# Patient Record
Sex: Male | Born: 1990 | Race: White | Hispanic: No | Smoking: Current every day smoker
Health system: Southern US, Community
[De-identification: ages and names within clinical notes are randomized; demographics above are authoritative.]

## PROBLEM LIST (undated history)

## (undated) DIAGNOSIS — K759 Inflammatory liver disease, unspecified: Secondary | ICD-10-CM

## (undated) DIAGNOSIS — G43909 Migraine, unspecified, not intractable, without status migrainosus: Secondary | ICD-10-CM

## (undated) DIAGNOSIS — K760 Fatty (change of) liver, not elsewhere classified: Secondary | ICD-10-CM

## (undated) DIAGNOSIS — K7460 Unspecified cirrhosis of liver: Secondary | ICD-10-CM

## (undated) DIAGNOSIS — G9389 Other specified disorders of brain: Secondary | ICD-10-CM

## (undated) DIAGNOSIS — R569 Unspecified convulsions: Secondary | ICD-10-CM

## (undated) DIAGNOSIS — I10 Essential (primary) hypertension: Secondary | ICD-10-CM

## (undated) DIAGNOSIS — E559 Vitamin D deficiency, unspecified: Secondary | ICD-10-CM

## (undated) DIAGNOSIS — S8991XA Unspecified injury of right lower leg, initial encounter: Secondary | ICD-10-CM

## (undated) HISTORY — DX: Migraine, unspecified, not intractable, without status migrainosus: G43.909

## (undated) HISTORY — PX: NASAL SINUS SURGERY: SHX719

## (undated) HISTORY — DX: Unspecified convulsions: R56.9

## (undated) HISTORY — DX: Other specified disorders of brain: G93.89

## (undated) HISTORY — DX: Unspecified injury of right lower leg, initial encounter: S89.91XA

## (undated) HISTORY — DX: Vitamin D deficiency, unspecified: E55.9

---
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
## 2018-08-15 DIAGNOSIS — J029 Acute pharyngitis, unspecified: Secondary | ICD-10-CM | POA: Diagnosis not present

## 2018-08-15 DIAGNOSIS — Z20828 Contact with and (suspected) exposure to other viral communicable diseases: Secondary | ICD-10-CM | POA: Diagnosis not present

## 2018-08-15 DIAGNOSIS — J019 Acute sinusitis, unspecified: Secondary | ICD-10-CM | POA: Diagnosis not present

## 2018-08-15 DIAGNOSIS — J069 Acute upper respiratory infection, unspecified: Secondary | ICD-10-CM | POA: Diagnosis not present

## 2018-11-10 ENCOUNTER — Other Ambulatory Visit: Payer: Self-pay

## 2018-11-10 DIAGNOSIS — Z20822 Contact with and (suspected) exposure to covid-19: Secondary | ICD-10-CM

## 2018-11-12 LAB — NOVEL CORONAVIRUS, NAA: SARS-CoV-2, NAA: NOT DETECTED

## 2020-01-12 ENCOUNTER — Encounter: Payer: Self-pay | Admitting: Emergency Medicine

## 2020-01-12 ENCOUNTER — Other Ambulatory Visit: Payer: Self-pay

## 2020-01-12 ENCOUNTER — Ambulatory Visit
Admission: EM | Admit: 2020-01-12 | Discharge: 2020-01-12 | Disposition: A | Discharge status: Home / Self Care | Payer: BC Managed Care – PPO | Attending: Emergency Medicine | Admitting: Emergency Medicine

## 2020-01-12 DIAGNOSIS — R059 Cough, unspecified: Secondary | ICD-10-CM

## 2020-01-12 DIAGNOSIS — J069 Acute upper respiratory infection, unspecified: Secondary | ICD-10-CM

## 2020-01-12 MED ORDER — DEXAMETHASONE SODIUM PHOSPHATE 10 MG/ML IJ SOLN
10.0000 mg | Freq: Once | INTRAMUSCULAR | Status: AC
  Administered 2020-01-12: 10 mg via INTRAMUSCULAR

## 2020-01-12 MED ORDER — ALBUTEROL SULFATE HFA 108 (90 BASE) MCG/ACT IN AERS
2.0000 | INHALATION_SPRAY | Freq: Once | RESPIRATORY_TRACT | Status: AC
  Administered 2020-01-12: 2 via RESPIRATORY_TRACT

## 2020-01-12 MED ORDER — PREDNISONE 10 MG (21) PO TBPK: ORAL_TABLET | Freq: Every day | ORAL | 0 refills | Status: DC

## 2020-01-12 MED ORDER — BENZONATATE 100 MG PO CAPS: 100.0000 mg | ORAL_CAPSULE | Freq: Three times a day (TID) | ORAL | 0 refills | Status: DC

## 2020-01-12 NOTE — Discharge Instructions (Signed)

## 2020-01-12 NOTE — ED Provider Notes (Signed)
[facility]   [id number] 01/12/20 Arrival Time: 0831   CC: Cough  SUBJECTIVE: History from: patient.  Jake King is a 29 y.o. male who presents with chest congestion, productive cough, wheezing, and hoarseness x 5 days.  Denies sick exposure to COVID, flu or strep.  Does admit to tobacco use.  Has tried OTC medications without relief.  Symptoms are made worse at night.  Reports previous symptoms in the past with bronchitis.   Denies fever, chills, chest pain, nausea, changes in bowel or bladder habits.    ROS: As per HPI.  All other pertinent ROS negative.     History reviewed. No pertinent past medical history. Past Surgical History:  Procedure Laterality Date  . NASAL SINUS SURGERY     Allergies  Allergen Reactions  . Sulfa Antibiotics     Joints swell   No current facility-administered medications on file prior to encounter.   No current outpatient medications on file prior to encounter.   Social History   Socioeconomic History  . Marital status: Single    Spouse name: Not on file  . Number of children: Not on file  . Years of education: Not on file  . Highest education level: Not on file  Occupational History  . Not on file  Tobacco Use  . Smoking status: Never Smoker  . Smokeless tobacco: Never Used  Substance and Sexual Activity  . Alcohol use: Not on file  . Drug use: Not on file  . Sexual activity: Not on file  Other Topics Concern  . Not on file  Social History Narrative  . Not on file   Social Determinants of Health   Financial Resource Strain:   . Difficulty of Paying Living Expenses: Not on file  Food Insecurity:   . Worried About Programme researcher, broadcasting/film/video in the Last Year: Not on file  . Ran Out of Food in the Last Year: Not on file  Transportation Needs:   . Lack of Transportation (Medical): Not on file  . Lack of Transportation (Non-Medical): Not on file  Physical Activity:   . Days of Exercise per Week: Not on file  .  Minutes of Exercise per Session: Not on file  Stress:   . Feeling of Stress : Not on file  Social Connections:   . Frequency of Communication with Friends and Family: Not on file  . Frequency of Social Gatherings with Friends and Family: Not on file  . Attends Religious Services: Not on file  . Active Member of Clubs or Organizations: Not on file  . Attends Banker Meetings: Not on file  . Marital Status: Not on file  Intimate Partner Violence:   . Fear of Current or Ex-Partner: Not on file  . Emotionally Abused: Not on file  . Physically Abused: Not on file  . Sexually Abused: Not on file   No family history on file.  OBJECTIVE:  Vitals:   01/12/20 0844 01/12/20 0847  BP: (!) 143/98   Pulse: (!) 105   Resp: 16   Temp: 98.5 F (36.9 C)   TempSrc: Oral   SpO2: 96%   Weight:  180 lb (81.6 kg)  Height:  5\' 11"  (1.803 m)     General appearance: alert; appears fatigued, but nontoxic; speaking in full sentences and tolerating own secretions HEENT: NCAT; Ears: EACs clear, TMs pearly gray; Eyes: PERRL.  EOM grossly intact. Nose: nares patent without rhinorrhea, Throat: oropharynx clear, tonsils non erythematous or  enlarged, uvula midline  Neck: supple without LAD Lungs: unlabored respirations, symmetrical air entry; cough: moderate; no respiratory distress; harsh wheezes heard throughout bilateral lungs Heart: regular rate and rhythm.   Skin: warm and dry Psychological: alert and cooperative; normal mood and affect  ASSESSMENT & PLAN:  1. Cough   2. Viral URI with cough     Meds ordered this encounter  Medications  . benzonatate (TESSALON) 100 MG capsule    Sig: Take 1 capsule (100 mg total) by mouth every 8 (eight) hours.    Dispense:  21 capsule    Refill:  0    Order Specific Question:   Supervising Provider    Answer:   Jake King [[id number]]    COVID testing ordered.  It will take between 5-7 days for test results.  Someone will contact you  regarding abnormal results.    In the meantime: You should remain isolated in your home for 10 days from symptom onset AND greater than 72 hours after symptoms resolution (absence of fever without the use of fever-reducing medication and improvement in respiratory symptoms), whichever is longer Get plenty of rest and push fluids Tessalon Perles prescribed for cough Use OTC zyrtec for nasal congestion, runny nose, and/or sore throat Use OTC flonase for nasal congestion and runny nose Use medications daily for symptom relief Use OTC medications like ibuprofen or tylenol as needed fever or pain Call or go to the ED if you have any new or worsening symptoms such as fever, worsening cough, shortness of breath, chest tightness, chest pain, turning blue, changes in mental status, etc...   Steroid shot, prednisone, and albuterol inhaler given for bronchitis  Reviewed expectations re: course of current medical issues. Questions answered. Outlined signs and symptoms indicating need for more acute intervention. Patient verbalized understanding. After Visit Summary given.         [name], PA-C 01/12/20 0900

## 2020-01-12 NOTE — ED Triage Notes (Addendum)
Chest congestion, cough and is having some hoarsness since Sunday

## 2020-01-13 LAB — SARS-COV-2, NAA 2 DAY TAT

## 2020-01-13 LAB — NOVEL CORONAVIRUS, NAA: SARS-CoV-2, NAA: NOT DETECTED

## 2020-03-04 ENCOUNTER — Telehealth (INDEPENDENT_AMBULATORY_CARE_PROVIDER_SITE_OTHER): Payer: Self-pay | Admitting: Nurse Practitioner

## 2020-03-04 ENCOUNTER — Other Ambulatory Visit: Payer: Self-pay

## 2020-03-04 ENCOUNTER — Ambulatory Visit (HOSPITAL_COMMUNITY)
Admission: RE | Admit: 2020-03-04 | Discharge: 2020-03-04 | Disposition: A | Discharge status: Home / Self Care | Payer: BC Managed Care – PPO | Source: Ambulatory Visit | Attending: Nurse Practitioner | Admitting: Nurse Practitioner

## 2020-03-04 ENCOUNTER — Encounter (INDEPENDENT_AMBULATORY_CARE_PROVIDER_SITE_OTHER): Payer: Self-pay | Admitting: Nurse Practitioner

## 2020-03-04 ENCOUNTER — Ambulatory Visit (INDEPENDENT_AMBULATORY_CARE_PROVIDER_SITE_OTHER): Payer: BC Managed Care – PPO | Admitting: Nurse Practitioner

## 2020-03-04 VITALS — BP 150/96 | HR 75 | Temp 97.5°F | Ht 71.0 in | Wt 187.6 lb

## 2020-03-04 DIAGNOSIS — Z87898 Personal history of other specified conditions: Secondary | ICD-10-CM

## 2020-03-04 DIAGNOSIS — E559 Vitamin D deficiency, unspecified: Secondary | ICD-10-CM

## 2020-03-04 DIAGNOSIS — I10 Essential (primary) hypertension: Secondary | ICD-10-CM | POA: Diagnosis not present

## 2020-03-04 DIAGNOSIS — R079 Chest pain, unspecified: Secondary | ICD-10-CM

## 2020-03-04 DIAGNOSIS — R5383 Other fatigue: Secondary | ICD-10-CM | POA: Diagnosis not present

## 2020-03-04 DIAGNOSIS — R202 Paresthesia of skin: Secondary | ICD-10-CM | POA: Diagnosis not present

## 2020-03-04 DIAGNOSIS — R0789 Other chest pain: Secondary | ICD-10-CM | POA: Diagnosis not present

## 2020-03-04 DIAGNOSIS — G43119 Migraine with aura, intractable, without status migrainosus: Secondary | ICD-10-CM

## 2020-03-04 DIAGNOSIS — R0602 Shortness of breath: Secondary | ICD-10-CM | POA: Diagnosis not present

## 2020-03-04 DIAGNOSIS — R531 Weakness: Secondary | ICD-10-CM | POA: Diagnosis not present

## 2020-03-04 DIAGNOSIS — G1220 Motor neuron disease, unspecified: Secondary | ICD-10-CM

## 2020-03-04 MED ORDER — AMLODIPINE BESYLATE 2.5 MG PO TABS: 2.5000 mg | ORAL_TABLET | Freq: Every day | ORAL | 3 refills | Status: DC

## 2020-03-04 NOTE — Patient Instructions (Addendum)
Please proceed to the radiology department at [facility] to have your chest x-ray.

## 2020-03-04 NOTE — Progress Notes (Signed)
Subjective:  Patient ID: Jake King, male    DOB: 10/16/1990  Age: 29 y.o. MRN: [id number]  CC:  Chief Complaint  Patient presents with  . Establish Care      HPI  This patient arrives today for the above.  He has not had a primary care provider in approximately 2 years.  He tells me he moved here from [place] about 2 years ago.  Before moving he mentions that he had been told he has a mass in his brain as well as in his sinus cavity and was told to follow-up and have repeat scans every 6 months and they did want him to see a neurosurgeon.  He tells me he has not been having the scans completed since he is moved down here nor has he seen a neurosurgeon.  He tells me that he does have a history of migraines and they initially started when he was approximately 29 years old.  He would get them about once a year and then it increased to approximately 3 times a year.  Over the last 3 months he tells me he has been having a migraine every other week.  He does get an aura which is usually a pain in his neck.  He experiences nausea and vomiting with his migraine.  Migraines last 2 to 4 days.  He is also noticed that he has been experiencing neurologic changes.  Specifically sensory deficits to his left hand (dorsal side also includes digits 1-4), sensory deficits to his right leg, foot drop in his right foot, and weakness to his left leg.  He does have a family history of MS.  He also mentions to me that he has what he calls chest wall pain to the left side of his sternum around the 5th-6th rib.  This is been ongoing for a few months.  He tells me that the pain is all the time and worsens when he lays on his left side.  He denies any shortness of breath, or worsening pain with physical activity.  He tells me when he coughs the pain is worse.  He tells me that a few months ago he does recall some sort of upper respiratory infection, or possibly pneumonia.  The pain to his chest wall has  persisted since then.  Past Medical History:  Diagnosis Date  . Brain mass   . Migraine   . Right knee injury   . Seizures ([place])   . Vitamin D deficiency       Family History  Problem Relation Age of Onset  . Other Mother        "Twisted Colon"  . Mental illness Mother   . Diabetes Father   . Hypertension Father   . Pneumonia Father   . Congestive Heart Failure Paternal Grandfather   . Hypertension Paternal Grandfather   . Diabetes Paternal Grandfather   . Multiple sclerosis Paternal Grandfather     Social History   Social History Narrative  . Not on file   Social History   Tobacco Use  . Smoking status: Current Every Day Smoker    Packs/day: 0.50  . Smokeless tobacco: Never Used  Substance Use Topics  . Alcohol use: Yes    Alcohol/week: 2.0 - 3.0 standard drinks    Types: 2 - 3 Cans of beer per week     No outpatient medications have been marked as taking for the 03/04/20 encounter (Office Visit) with Ailene Ards,  NP.    ROS:  Review of Systems  Constitutional: Negative for chills, fever and weight loss.  Eyes: Positive for blurred vision.  Respiratory: Positive for cough, sputum production, shortness of breath and wheezing (in the mornings upon waking up). Hemoptysis: brown and yellow.   Cardiovascular: Positive for chest pain and PND. Negative for orthopnea (Pain when laying on left side).  Gastrointestinal: Positive for diarrhea. Negative for abdominal pain and blood in stool.  Musculoskeletal: Positive for neck pain.  Neurological: Positive for dizziness, sensory change (left hand; resolved now), weakness and headaches.     Objective:   Today's Vitals: BP (!) 150/96   Pulse 75   Temp (!) 97.5 F (36.4 C)   Ht 5' 11"  (1.803 m)   Wt 187 lb 9.6 oz (85.1 kg)   SpO2 97%   BMI 26.16 kg/m  Vitals with BMI 03/04/2020 01/12/2020  Height 5' 11"  5' 11"   Weight 187 lbs 10 oz 180 lbs  BMI 09.38 18.29  Systolic 937 169  Diastolic 96 98  Pulse 75  105     Physical Exam Vitals reviewed.  Constitutional:      Appearance: Normal appearance.  HENT:     Head: Normocephalic and atraumatic.  Cardiovascular:     Rate and Rhythm: Normal rate and regular rhythm.  Pulmonary:     Effort: Pulmonary effort is normal.     Breath sounds: Wheezing present.  Chest:     Chest wall: Tenderness present. No mass.    Musculoskeletal:     Cervical back: Neck supple.     Comments: Weakness of cervical spine with rotation to right  Skin:    General: Skin is warm and dry.  Neurological:     Mental Status: He is alert and oriented to person, place, and time.     Cranial Nerves: Cranial nerves are intact.     Sensory: Sensory deficit present.     Motor: Weakness and tremor present. No pronator drift.     Coordination: Coordination is intact.     Gait: Gait is intact.     Deep Tendon Reflexes:     Reflex Scores:      Brachioradialis reflexes are 2+ on the right side and 2+ on the left side.      Patellar reflexes are 2+ on the right side and 2+ on the left side.    Comments: Left arm weakness when compared to right arm; Right foot drop (inability to fully dorsiflex), left leg weakness when compared to right, reduced sensation to right leg  Psychiatric:        Mood and Affect: Mood normal.        Behavior: Behavior normal.        Thought Content: Thought content normal.        Judgment: Judgment normal.      EKG: NSR     Assessment and Plan   1. Chest pain, unspecified type   2. History of seizures   3. Weakness   4. Paresthesias   5. Intractable migraine with aura without status migrainosus   6. 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 Hypertension, unspecified type   9. Fatigue, unspecified type      Plan: 1.  Probable costochondritis, however will send patient for chest x-ray for further evaluation. 2.-5.,  7.  Will refer patient to neurology for further evaluation.  We will also order MRI of brain,  cervical spine, thoracic spine, and  lumbar spine as I am concerned he may have MS.  Also would like to evaluate whether or not he continues to have lesions/mass on his brain.  We will also check blood work as ordered below for further evaluation. 6.  We will check blood work for further evaluation. 8.  We will initiate him on low-dose amlodipine, he will return in 2 weeks to check blood pressure. 9.  We will check blood work for further evaluation.   Tests ordered Orders Placed This Encounter  Procedures  . MR Brain W Wo Contrast  . MR CERVICAL SPINE W WO CONTRAST  . MR THORACIC SPINE W WO CONTRAST  . MR Lumbar Spine W Wo Contrast  . DG Chest 2 View  . CBC with Differential/Platelets  . CMP with eGFR([facility])  . Lipid Panel  . Hemoglobin A1c  . TSH  . T3, Free  . T4, Free  . Vitamin D, 25-hydroxy  . Ambulatory referral to Neurology  . EKG 12-Lead      Meds ordered this encounter  Medications  . amLODipine (NORVASC) 2.5 MG tablet    Sig: Take 1 tablet (2.5 mg total) by mouth daily.    Dispense:  90 tablet    Refill:  3    Order Specific Question:   Supervising Provider    Answer:   Doree Albee [[id number]]    Patient to follow-up in 2 weeks or sooner as needed for close monitoring of blood pressure.  Ailene Ards, NP

## 2020-03-04 NOTE — Telephone Encounter (Signed)
I have placed order for MRI of brain, cervical/thoracic/lumbar spine. Please make sure these are approved via insurance and scheduled. I have also ordered referral to neurology please work on this as well.

## 2020-03-05 ENCOUNTER — Other Ambulatory Visit (INDEPENDENT_AMBULATORY_CARE_PROVIDER_SITE_OTHER): Payer: Self-pay | Admitting: Nurse Practitioner

## 2020-03-05 DIAGNOSIS — R748 Abnormal levels of other serum enzymes: Secondary | ICD-10-CM

## 2020-03-05 LAB — COMPLETE METABOLIC PANEL WITH GFR
AG Ratio: 1.8 (calc) (ref 1.0–2.5)
ALT: 97 U/L — ABNORMAL HIGH (ref 9–46)
AST: 169 U/L — ABNORMAL HIGH (ref 10–40)
Albumin: 4.1 g/dL (ref 3.6–5.1)
Alkaline phosphatase (APISO): 100 U/L (ref 36–130)
BUN: 7 mg/dL (ref 7–25)
CO2: 28 mmol/L (ref 20–32)
Calcium: 8.8 mg/dL (ref 8.6–10.3)
Chloride: 104 mmol/L (ref 98–110)
Creat: 0.79 mg/dL (ref 0.60–1.35)
GFR, Est African American: 143 mL/min/{1.73_m2} (ref >=60)
GFR, Est Non African American: 123 mL/min/{1.73_m2} (ref >=60)
Globulin: 2.3 g/dL (calc) (ref 1.9–3.7)
Glucose, Bld: 85 mg/dL (ref 65–99)
Potassium: 4 mmol/L (ref 3.5–5.3)
Sodium: 143 mmol/L (ref 135–146)
Total Bilirubin: 0.7 mg/dL (ref 0.2–1.2)
Total Protein: 6.4 g/dL (ref 6.1–8.1)

## 2020-03-05 LAB — CBC WITH DIFFERENTIAL/PLATELET
Absolute Monocytes: 388 cells/uL (ref 200–950)
Basophils Absolute: 30 cells/uL (ref 0–200)
Basophils Relative: 0.8 %
Eosinophils Absolute: 87 cells/uL (ref 15–500)
Eosinophils Relative: 2.3 %
HCT: 39.5 % (ref 38.5–50.0)
Hemoglobin: 13.6 g/dL (ref 13.2–17.1)
Lymphs Abs: 882 cells/uL (ref 850–3900)
MCH: 33.9 pg — ABNORMAL HIGH (ref 27.0–33.0)
MCHC: 34.4 g/dL (ref 32.0–36.0)
MCV: 98.5 fL (ref 80.0–100.0)
MPV: 9.9 fL (ref 7.5–12.5)
Monocytes Relative: 10.2 %
Neutro Abs: 2413 cells/uL (ref 1500–7800)
Neutrophils Relative %: 63.5 %
Platelets: 250 10*3/uL (ref 140–400)
RBC: 4.01 10*6/uL — ABNORMAL LOW (ref 4.20–5.80)
RDW: 14.3 % (ref 11.0–15.0)
Total Lymphocyte: 23.2 %
WBC: 3.8 10*3/uL (ref 3.8–10.8)

## 2020-03-05 LAB — HEMOGLOBIN A1C
Hgb A1c MFr Bld: 5.2 % of total Hgb (ref <5.7)
Mean Plasma Glucose: 103 mg/dL
eAG (mmol/L): 5.7 mmol/L

## 2020-03-05 LAB — LIPID PANEL
Cholesterol: 141 mg/dL (ref <200)
HDL: 44 mg/dL (ref >=40)
LDL Cholesterol (Calc): 65 mg/dL (calc)
Non-HDL Cholesterol (Calc): 97 mg/dL (calc) (ref <130)
Total CHOL/HDL Ratio: 3.2 (calc) (ref <5.0)
Triglycerides: 308 mg/dL — ABNORMAL HIGH (ref <150)

## 2020-03-05 LAB — VITAMIN D 25 HYDROXY (VIT D DEFICIENCY, FRACTURES): Vit D, 25-Hydroxy: 22 ng/mL — ABNORMAL LOW (ref 30–100)

## 2020-03-05 LAB — T4, FREE: Free T4: 1 ng/dL (ref 0.8–1.8)

## 2020-03-05 LAB — T3, FREE: T3, Free: 3.9 pg/mL (ref 2.3–4.2)

## 2020-03-05 LAB — TSH: TSH: 0.27 mIU/L — ABNORMAL LOW (ref 0.40–4.50)

## 2020-03-05 MED ORDER — PREDNISONE 20 MG PO TABS: 40.0000 mg | ORAL_TABLET | Freq: Every day | ORAL | 0 refills | Status: DC

## 2020-03-05 NOTE — Progress Notes (Signed)
Nellie, I have ordered ultrasound of this patient's liver after viewing his lab results. Please work on this authorization in addition to the MRI orders from yesterday. Thank you.

## 2020-03-13 NOTE — Telephone Encounter (Signed)
Prior Berkley Harvey is submitted; pending approval;tba.

## 2020-03-14 ENCOUNTER — Telehealth (INDEPENDENT_AMBULATORY_CARE_PROVIDER_SITE_OTHER): Payer: Self-pay

## 2020-03-14 NOTE — Telephone Encounter (Signed)
Molli Knock, try to call him on Monday to verify his insurance. I see that he has mychart you could consider sending him a message in [place] as well.

## 2020-03-15 ENCOUNTER — Encounter (INDEPENDENT_AMBULATORY_CARE_PROVIDER_SITE_OTHER): Payer: Self-pay

## 2020-03-18 ENCOUNTER — Telehealth (INDEPENDENT_AMBULATORY_CARE_PROVIDER_SITE_OTHER): Payer: Self-pay

## 2020-03-18 NOTE — Telephone Encounter (Signed)
Will have to have a provider for a peer 2 peer. [phone]. XVE:[id number]

## 2020-03-18 NOTE — Telephone Encounter (Signed)
Peer-to-peer completed and authorization number is [id number]; approval through the following dates: 03/18/20-09/13/20.

## 2020-03-19 ENCOUNTER — Other Ambulatory Visit: Payer: Self-pay

## 2020-03-19 ENCOUNTER — Telehealth (INDEPENDENT_AMBULATORY_CARE_PROVIDER_SITE_OTHER): Payer: BC Managed Care – PPO | Admitting: Nurse Practitioner

## 2020-03-19 ENCOUNTER — Encounter (INDEPENDENT_AMBULATORY_CARE_PROVIDER_SITE_OTHER): Payer: Self-pay | Admitting: Nurse Practitioner

## 2020-03-19 DIAGNOSIS — E559 Vitamin D deficiency, unspecified: Secondary | ICD-10-CM

## 2020-03-19 DIAGNOSIS — R531 Weakness: Secondary | ICD-10-CM

## 2020-03-19 DIAGNOSIS — R748 Abnormal levels of other serum enzymes: Secondary | ICD-10-CM

## 2020-03-19 DIAGNOSIS — R202 Paresthesia of skin: Secondary | ICD-10-CM | POA: Diagnosis not present

## 2020-03-19 DIAGNOSIS — M940 Chondrocostal junction syndrome [Tietze]: Secondary | ICD-10-CM

## 2020-03-19 DIAGNOSIS — I10 Essential (primary) hypertension: Secondary | ICD-10-CM

## 2020-03-19 NOTE — Progress Notes (Signed)
Due to national recommendations of social distancing related to the COVID19 pandemic, an audio-only tele-health visit was felt to be the most appropriate encounter type for this patient today. I connected with  Jake King on 03/19/20 utilizing audio-only technology and verified that I am speaking with the correct person using two identifiers. The patient was located at their home, and I was located at the office of [facility] during the encounter. I discussed the limitations of evaluation and management by telemedicine. The patient expressed understanding and agreed to proceed.    Subjective:  Patient ID: Jake King, male    DOB: 08/05/1990  Age: 30 y.o. MRN: [id number]  CC:  Chief Complaint  Patient presents with  . Hypertension  . Other    Elevated liver enzymes, vitamin D deficiency, costochondritis, neurologic changes, history of brain mass      HPI  This patient arrives today for virtual visit for the above.  Hypertension: At last office visit he was establishing care he was noted to have elevated blood pressure.  He had mention that he has noticed elevated blood pressure in the past so he was started on 2.5 mg of amlodipine daily.  He tells me he does not have an at home blood pressure cuffs were not able to check his blood pressure today.  He tells me has been tolerating the medication well.  Elevated liver enzymes: Blood work at last office visit show elevated liver enzymes.  AST was 169 and ALT was 97.  He does have a history of substance abuse and does not drink alcohol on regular basis.  Alkaline phosphatase and albumin were within normal limits.  Ultrasound of liver has been ordered and is scheduled for next week.  Vitamin D deficiency: Last office visit serum level of vitamin D was 22.  He has started taking vitamin D3.  He is taking a total of about 10,700 IUs daily from vitamin D3 supplementation as well as taking a  multivitamin.  Costochondritis: He was complaining of chest wall pain and we did treat him with prednisone.  He tells me today that he has completed his course of prednisone and the pain has resolved.  Neurologic changes/History of brain mass: He had mentioned that he has a history of a brain mass at last office visit and has not been getting his scans every 6 months as was previously recommended per his report from previous provider. He does have MRI scheduled of the brain next week for further evaluation.  He is also been experiencing some neurologic changes such as weakness to the left leg, sensory changes to the right leg, and right foot drop.  He does have MRI of the spine scheduled next week as well for further evaluation of possible MS.  He tells me that his symptoms appear to be stable at this time.  Past Medical History:  Diagnosis Date  . Brain mass   . Migraine   . Right knee injury   . Seizures ([facility])   . Vitamin D deficiency       Family History  Problem Relation Age of Onset  . Other Mother        "Twisted Colon"  . Mental illness Mother   . Diabetes Father   . Hypertension Father   . Pneumonia Father   . Congestive Heart Failure Paternal Grandfather   . Hypertension Paternal Grandfather   . Diabetes Paternal Grandfather   . Multiple sclerosis Paternal Grandfather  Social History   Social History Narrative  . Not on file   Social History   Tobacco Use  . Smoking status: Current Every Day Smoker    Packs/day: 0.50  . Smokeless tobacco: Never Used  Substance Use Topics  . Alcohol use: Yes    Alcohol/week: 2.0 - 3.0 standard drinks    Types: 2 - 3 Cans of beer per week     Current Meds  Medication Sig  . amLODipine (NORVASC) 2.5 MG tablet Take 1 tablet (2.5 mg total) by mouth daily.  . Cholecalciferol (VITAMIN D-3) 5000 UNIT/ML LIQD Place 2 mLs under the tongue daily. Total of 10,000IUs daily  . Multiple Vitamins-Minerals (MULTIVITAMIN WITH MINERALS)  tablet Take 1 tablet by mouth daily.    ROS:  Review of Systems  Constitutional: Positive for malaise/fatigue. Negative for fever.  Eyes: Positive for blurred vision.  Respiratory: Negative for shortness of breath.   Cardiovascular: Negative for chest pain.  Neurological: Positive for sensory change and weakness.     Objective:   Today's Vitals: There were no vitals taken for this visit. Vitals with BMI 03/19/2020 03/04/2020 01/12/2020  Height - 5\' 11"  5\' 11"   Weight - 187 lbs 10 oz 180 lbs  BMI - 26.18 25.12  Systolic (No Data) 150 143  Diastolic (No Data) 96 98  Pulse - 75 105     Physical Exam Comprehensive physical exam was not conducted today as office visit was conducted remotely over the phone.  He did sound well over the phone he was alert and oriented and answers questions appropriately.  He appeared to have appropriate thought processes and judgment.      Assessment and Plan   1. Elevated liver enzymes   2. Weakness   3. Paresthesias   4. Vitamin D deficiency   5. Hypertension, unspecified type   6. Costochondritis      Plan: 1.  He will have ultrasound as scheduled.  We will collect blood work next week after imaging tests including CMP, GGT, hepatitis panel, PT-INR for further evaluation. 2.,  3.  He will follow-up to have MRIs completed as scheduled.  He was also referred to neurology and does have an appointment in 2 weeks and plans on following up with them. 4.  We will consider rechecking serum vitamin D level at blood work next week. 5.  When he comes in to have blood work checked next week we will also have nurses check his blood pressure. 6.  Resolved, no further work-up recommended at this time.  Tests ordered No orders of the defined types were placed in this encounter.     No orders of the defined types were placed in this encounter.   Patient to follow-up next week for nurses visit to have blood pressure checked and then to go to the lab  for blood draw.  Blood work has been ordered today in preparation for her visit next week. Telephone conversation lasted for 14 minutes.  , NP

## 2020-03-25 ENCOUNTER — Telehealth (INDEPENDENT_AMBULATORY_CARE_PROVIDER_SITE_OTHER): Payer: Self-pay

## 2020-03-25 NOTE — Telephone Encounter (Signed)
Patient called and was asking when his Brain MRI, all his back MRI's and his [place] if the are approved and when will these be scheduled?   I see a Peer to Peer approval but not sure on which orders are approved?  Patient would like a call back at [phone]

## 2020-03-25 NOTE — Telephone Encounter (Signed)
Called patient and gave him the dates for his images and his ultrasound. Patient verbalized an understanding and will call the billing department if he has any questions about his insurance.

## 2020-03-25 NOTE — Telephone Encounter (Signed)
Everything is approved and I believe they are all scheduled for 03/28/20. When I spoke to him last he was aware of these being scheduled and was aware of the times. I am not sure why he is now calling asking about the appointment dates/times, but if you could please make sure these are in fact scheduled on 03/28/20 and then call him back to let him know this and that scans were approved when I completed the prior auth phone call the other day. Thank you. Sorry for any confusion.

## 2020-03-28 ENCOUNTER — Ambulatory Visit (HOSPITAL_COMMUNITY)
Admission: RE | Admit: 2020-03-28 | Discharge: 2020-03-28 | Disposition: A | Discharge status: Home / Self Care | Payer: BC Managed Care – PPO | Source: Ambulatory Visit | Attending: Nurse Practitioner | Admitting: Nurse Practitioner

## 2020-03-28 ENCOUNTER — Ambulatory Visit (INDEPENDENT_AMBULATORY_CARE_PROVIDER_SITE_OTHER): Payer: BC Managed Care – PPO | Admitting: Nurse Practitioner

## 2020-03-28 ENCOUNTER — Other Ambulatory Visit: Payer: Self-pay

## 2020-03-28 ENCOUNTER — Other Ambulatory Visit (INDEPENDENT_AMBULATORY_CARE_PROVIDER_SITE_OTHER): Payer: Self-pay | Admitting: Nurse Practitioner

## 2020-03-28 VITALS — BP 128/82

## 2020-03-28 DIAGNOSIS — R748 Abnormal levels of other serum enzymes: Secondary | ICD-10-CM | POA: Diagnosis not present

## 2020-03-28 DIAGNOSIS — G1220 Motor neuron disease, unspecified: Secondary | ICD-10-CM

## 2020-03-28 DIAGNOSIS — R945 Abnormal results of liver function studies: Secondary | ICD-10-CM | POA: Diagnosis not present

## 2020-03-28 DIAGNOSIS — E559 Vitamin D deficiency, unspecified: Secondary | ICD-10-CM | POA: Diagnosis not present

## 2020-03-28 DIAGNOSIS — K760 Fatty (change of) liver, not elsewhere classified: Secondary | ICD-10-CM | POA: Diagnosis not present

## 2020-03-28 DIAGNOSIS — R531 Weakness: Secondary | ICD-10-CM

## 2020-03-28 DIAGNOSIS — R202 Paresthesia of skin: Secondary | ICD-10-CM

## 2020-03-28 DIAGNOSIS — M5124 Other intervertebral disc displacement, thoracic region: Secondary | ICD-10-CM | POA: Diagnosis not present

## 2020-03-28 DIAGNOSIS — I10 Essential (primary) hypertension: Secondary | ICD-10-CM | POA: Diagnosis not present

## 2020-03-28 DIAGNOSIS — M47812 Spondylosis without myelopathy or radiculopathy, cervical region: Secondary | ICD-10-CM | POA: Diagnosis not present

## 2020-03-28 DIAGNOSIS — M4802 Spinal stenosis, cervical region: Secondary | ICD-10-CM | POA: Diagnosis not present

## 2020-03-28 DIAGNOSIS — J3489 Other specified disorders of nose and nasal sinuses: Secondary | ICD-10-CM | POA: Diagnosis not present

## 2020-03-28 DIAGNOSIS — M47814 Spondylosis without myelopathy or radiculopathy, thoracic region: Secondary | ICD-10-CM | POA: Diagnosis not present

## 2020-03-28 DIAGNOSIS — M2578 Osteophyte, vertebrae: Secondary | ICD-10-CM | POA: Diagnosis not present

## 2020-03-28 DIAGNOSIS — M5126 Other intervertebral disc displacement, lumbar region: Secondary | ICD-10-CM | POA: Diagnosis not present

## 2020-03-28 DIAGNOSIS — M5127 Other intervertebral disc displacement, lumbosacral region: Secondary | ICD-10-CM | POA: Diagnosis not present

## 2020-03-28 DIAGNOSIS — G930 Cerebral cysts: Secondary | ICD-10-CM | POA: Diagnosis not present

## 2020-03-28 DIAGNOSIS — R937 Abnormal findings on diagnostic imaging of other parts of musculoskeletal system: Secondary | ICD-10-CM

## 2020-03-28 MED ORDER — GADOBUTROL 1 MMOL/ML IV SOLN
8.0000 mL | Freq: Once | INTRAVENOUS | Status: AC | PRN
  Administered 2020-03-28: 8 mL via INTRAVENOUS

## 2020-03-28 NOTE — Progress Notes (Signed)
I am ordering referral to neurosurgery for assistance with managing his symptoms and the abnormalities noted on his spine scans. Thank you.

## 2020-03-28 NOTE — Progress Notes (Unsigned)
Patient came into office today for a BP check and was 128/82 in left arm.  Patient stated that he did have a migraine a week ago and lost function of his right arm from elbow down to his hand and did not have any grip in his right hand and his right leg went completely numb and had a hard time walking. Patient stated that this happened during his migraine and took about a day to get a little better and he is still having some numbness from his right elbow into back of right hand and not able to lift right foot up at times.  Patient had all scans done today also.

## 2020-03-29 ENCOUNTER — Telehealth (INDEPENDENT_AMBULATORY_CARE_PROVIDER_SITE_OTHER): Payer: Self-pay | Admitting: Nurse Practitioner

## 2020-03-29 LAB — GAMMA GT: GGT: 664 U/L — ABNORMAL HIGH (ref 3–70)

## 2020-03-29 LAB — COMPLETE METABOLIC PANEL WITH GFR
AG Ratio: 2 (calc) (ref 1.0–2.5)
ALT: 94 U/L — ABNORMAL HIGH (ref 9–46)
AST: 185 U/L — ABNORMAL HIGH (ref 10–40)
Albumin: 4.5 g/dL (ref 3.6–5.1)
Alkaline phosphatase (APISO): 118 U/L (ref 36–130)
BUN: 8 mg/dL (ref 7–25)
CO2: 26 mmol/L (ref 20–32)
Calcium: 9.2 mg/dL (ref 8.6–10.3)
Chloride: 101 mmol/L (ref 98–110)
Creat: 0.7 mg/dL (ref 0.60–1.35)
GFR, Est African American: 149 mL/min/{1.73_m2} (ref >=60)
GFR, Est Non African American: 128 mL/min/{1.73_m2} (ref >=60)
Globulin: 2.2 g/dL (calc) (ref 1.9–3.7)
Glucose, Bld: 139 mg/dL (ref 65–139)
Potassium: 3.9 mmol/L (ref 3.5–5.3)
Sodium: 140 mmol/L (ref 135–146)
Total Bilirubin: 1.2 mg/dL (ref 0.2–1.2)
Total Protein: 6.7 g/dL (ref 6.1–8.1)

## 2020-03-29 LAB — PROTIME-INR
INR: 1
Prothrombin Time: 10.7 s (ref 9.0–11.5)

## 2020-03-29 LAB — HEPATITIS C ANTIBODY
Hepatitis C Ab: NONREACTIVE
SIGNAL TO CUT-OFF: 0.01 (ref <1.00)

## 2020-03-29 LAB — HEPATITIS B CORE ANTIBODY, TOTAL: Hep B Core Total Ab: NONREACTIVE

## 2020-03-29 LAB — HEPATITIS B SURFACE ANTIGEN: Hepatitis B Surface Ag: NONREACTIVE

## 2020-03-29 LAB — HEPATITIS B SURFACE ANTIBODY,QUALITATIVE: Hep B S Ab: NONREACTIVE

## 2020-03-29 LAB — VITAMIN D 25 HYDROXY (VIT D DEFICIENCY, FRACTURES): Vit D, 25-Hydroxy: 35 ng/mL (ref 30–100)

## 2020-03-29 NOTE — Telephone Encounter (Signed)
I saw your note from 03/28/20 when the patient came in for BP check and discussed his migraine and neurological symptoms. Please call him and let him know that he should follow-up with neurology as scheduled, however if he has acute neurologic symptoms like that again he should consider going to the ER to rule out an acute stroke. Thank you.

## 2020-04-01 ENCOUNTER — Telehealth (INDEPENDENT_AMBULATORY_CARE_PROVIDER_SITE_OTHER): Payer: Self-pay | Admitting: Nurse Practitioner

## 2020-04-01 NOTE — Telephone Encounter (Addendum)
I called this patient this morning to discuss his blood work results as well as his imaging test.  His blood pressure when checked in the office the other day was much better compared to previous blood pressure so as he is tolerating his amlodipine he will continue on this medication as prescribed.  In addition I did tell him that if he experiences any acute neurologic symptoms again like he reported to the medical assistant the other day when he came to the office that he should seriously consider going to the hospital to differentiate between complex migraine versus acute stroke.  He tells me he understands.  I recommended that he continue to follow-up with neurology as scheduled he tells me that he will.  Due to the abnormalities noted on his spine and the fact that he has multiple neurologic findings on exam I have also referred him to neurosurgery for further assistance and evaluation of his back pain and neurologic symptoms.  His liver enzymes remain elevated but liver function tests seem to be within normal limits.  At this point I think that his elevated liver enzymes are related to his alcohol intake.  He tells me he is willing to try to cut back on his alcohol intake and will start doing this.  We will recheck metabolic panel in the near future to monitor these levels. If levels stay elevated despite cutting back on alcohol intake I have told him we will refer him to gastroenterology for further evaluation.  He tells me he understand and is agreeable to this plan.

## 2020-04-08 NOTE — Telephone Encounter (Signed)
Called patient and he is doing well. Patient has a neurology appointment tomorrow.   Patient has not heard from neurosurgery and wanted to know when this can be scheduled? I see a name of Keith Rake, MD and not sure if this can be checked?  Nellie, can you help with this?

## 2020-04-09 ENCOUNTER — Other Ambulatory Visit: Payer: Self-pay

## 2020-04-09 ENCOUNTER — Ambulatory Visit (INDEPENDENT_AMBULATORY_CARE_PROVIDER_SITE_OTHER): Payer: BC Managed Care – PPO | Admitting: Neurology

## 2020-04-09 ENCOUNTER — Telehealth (INDEPENDENT_AMBULATORY_CARE_PROVIDER_SITE_OTHER): Payer: Self-pay

## 2020-04-09 ENCOUNTER — Encounter: Payer: Self-pay | Admitting: Neurology

## 2020-04-09 VITALS — BP 119/70 | HR 92 | Ht 71.0 in | Wt 182.5 lb

## 2020-04-09 DIAGNOSIS — I10 Essential (primary) hypertension: Secondary | ICD-10-CM | POA: Diagnosis not present

## 2020-04-09 DIAGNOSIS — R200 Anesthesia of skin: Secondary | ICD-10-CM | POA: Diagnosis not present

## 2020-04-09 DIAGNOSIS — R29898 Other symptoms and signs involving the musculoskeletal system: Secondary | ICD-10-CM | POA: Diagnosis not present

## 2020-04-09 DIAGNOSIS — G43409 Hemiplegic migraine, not intractable, without status migrainosus: Secondary | ICD-10-CM | POA: Insufficient documentation

## 2020-04-09 MED ORDER — VERAPAMIL HCL ER 180 MG PO TBCR: 180.0000 mg | EXTENDED_RELEASE_TABLET | Freq: Every day | ORAL | 5 refills | Status: DC

## 2020-04-09 NOTE — Telephone Encounter (Signed)
Pt went to [facility] today. Was told that he does not need to see a Retail buyer. So at this time he will hold off  On this. But they  Did do medication changes. Will see a message from the [facility] provider. He was  only touching base to keep you notified of visit & instructions was given today.

## 2020-04-09 NOTE — Progress Notes (Signed)
[facility]  PATIENT: Jake King M Kawamoto DOB: 1990-09-02  REFERRING DOCTOR OR PCP: Jake ProwsSarah Gray, NP SOURCE: Patient, notes from primary care, imaging reports, MRI images personally reviewed.  _________________________________   HISTORICAL  CHIEF COMPLAINT:  Chief Complaint  Patient presents with  . New Patient (Initial Visit)    RM 2, alone. Internal referral/[name], NP for hx of seizures, weakness, paresthesias, migraines-c/f MS. Pt reports loss of motion in right foot. Back of right hand numb, right leg down to foot has loss of sensation. A couple weeks ago he had a bad migraine. Gets migraines every 1-2 weeks. Sensitive to sound, not smell. Has short term memory loss. Has tripped a lot. All has progressed in the last 6 months. Has hx seizures. 1st seizure about 3.5 yr ago. Had seizures monthly for about a yr then stopped  . Other    Move from [place] a couple yr ago. Has not followed with a doctor until now.  Last known seizure he thinks was 04/2017.    HISTORY OF PRESENT ILLNESS:  I [facility] the [facility] Jake King[facility] regarding his right-sided weakness and migraine headaches.  He is a 30 year old man who had the onset of right leg weakness and numbness around March 2021.   He noted that he had a week of numbness in his laft hand associated with a migraine initially. There were no right-sided symptoms at first. The numbness resolved after a few days. He had another migraine headache a week or 2 later that was severe and associated with right leg greater than arm weakness and to a lesser extent numbness. The weakness and numbness improved but did not completely resolve.   Since then, he has had right sided weakness and numbness.    Symptoms are always much worse with migraines.   However, he has constant mild right hand weakness and moderate right leg weakness in between  migraines.      He has a long history of migraines since pre-teens but they have not been frequent until last year.     Migraines occur about once evey couple weeks.   He feels migraines are debilitating.   He can't see well and feels weaker on the right.   He has intense pounding pian, nausea and vomiting.   He has photophobia and phonophobia.  Moving his head worsens the pain and pressure on his neck helps so,me.    When one occurs, he takes ibuprofen.   Pain persists 24-48 hours.  Laying in the dark can help some.    He gets irritable with the migraines.     He had generalized tonic-clonic seizures starting about 4 years ago.  Last one was 2 to 3 years ago.  His mom has seizures.   His paternal grandmother has migraines but no weakness.    His paternal grandfather has MS  He is concerned that he has multiple sclerosis.  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 There is internal septation. Otherwise, the brain appears normal.  Imaging of the cervical spine March 28, 2020 showed disc osteophyte complex to the right at C3-C4 causing mild to moderate right foraminal narrowing but no nerve root compression. At C4-C5, there is right greater than left disc osteophyte complex causing moderate right foraminal narrowing. This does not appear to cause nerve root compression. C5-C6 there are right greater than  left small disc osteophyte complexes but no significant foraminal narrowing. C6-C7, there is a mild left spurring but no nerve root compression. No spinal stenosis at any level.  Imaging of the thoracic spine March 28, 2020 showed a disc protrusion at T6-T7 towards the right effacing the thecal sac without causing spinal stenosis or nerve root compression. Schmorl's nodes are noted at T7-T8 and T8-T9.  Imaging of the lumbar spine March 28, 2020 showed a small central disc protrusion that does not lead to nerve root compression or  spinal stenosis.    REVIEW OF SYSTEMS: Constitutional: No fevers, chills, sweats, or change in appetite Eyes: No visual changes, double vision, eye pain Ear, nose and throat: No hearing loss, ear pain, nasal congestion, sore throat Cardiovascular: No chest pain, palpitations Respiratory: No shortness of breath at rest or with exertion.   No wheezes GastrointestinaI: No nausea, vomiting, diarrhea, abdominal pain, fecal incontinence Genitourinary: No dysuria, urinary retention or frequency.  No nocturia. Musculoskeletal: No neck pain, back pain Integumentary: No rash, pruritus, skin lesions Neurological: as above Psychiatric: No depression at this time.  No anxiety Endocrine: No palpitations, diaphoresis, change in appetite, change in weigh or increased thirst Hematologic/Lymphatic: No anemia, purpura, petechiae. Allergic/Immunologic: No itchy/runny eyes, nasal congestion, recent allergic reactions, rashes  ALLERGIES: Allergies  Allergen Reactions  . Atropine Swelling  . Sulfa Antibiotics     Joints swell    HOME MEDICATIONS:  Current Outpatient Medications:  .  amLODipine (NORVASC) 2.5 MG tablet, Take 1 tablet (2.5 mg total) by mouth daily., Disp: 90 tablet, Rfl: 3 .  Cholecalciferol (VITAMIN D-3) 5000 UNIT/ML LIQD, Place 2 mLs under the tongue daily. Total of 10,000IUs daily, Disp: , Rfl:  .  Multiple Vitamins-Minerals (MULTIVITAMIN WITH MINERALS) tablet, Take 1 tablet by mouth daily., Disp: , Rfl:   PAST MEDICAL HISTORY: Past Medical History:  Diagnosis Date  . Brain mass   . Migraine   . Right knee injury   . Seizures (HCC)   . Vitamin D deficiency     PAST SURGICAL HISTORY: Past Surgical History:  Procedure Laterality Date  . NASAL SINUS SURGERY    . NASAL SINUS SURGERY  2009    FAMILY HISTORY: Family History  Problem Relation Age of Onset  . Other Mother        "Twisted Colon"  . Mental illness Mother   . Diabetes Father   . Hypertension Father   .  Pneumonia Father   . Congestive Heart Failure Paternal Grandfather   . Hypertension Paternal Grandfather   . Diabetes Paternal Grandfather   . Multiple sclerosis Paternal Grandfather     SOCIAL HISTORY:  Social History   Socioeconomic History  . Marital status: Significant Other    Spouse name: [name]  . Number of children: 1  . Years of education: GED  . Highest education level: Not on file  Occupational History  . Occupation: [employer]  Tobacco Use  . Smoking status: Current Every Day Smoker    Packs/day: 0.50  . Smokeless tobacco: Former Engineer, water and Sexual Activity  . Alcohol use: Yes    Alcohol/week: 2.0 - 3.0 standard drinks    Types: 2 - 3 Cans of beer per week    Comment: 3-4 beers every night   . Drug use: Not Currently  . Sexual activity: Not on file  Other Topics Concern  . Not on file  Social History Narrative   Lives with Salvadore Dom [name]) and their kids  Caffeine use: none   Right handed    Social Determinants of Health   Financial Resource Strain: Not on file  Food Insecurity: Not on file  Transportation Needs: Not on file  Physical Activity: Not on file  Stress: Not on file  Social Connections: Not on file  Intimate Partner Violence: Not on file     PHYSICAL EXAM  Vitals:   04/09/20 1314  BP: 119/70  Pulse: 92  SpO2: 97%  Weight: 182 lb 8 oz (82.8 kg)  Height: 5\' 11"  (1.803 m)    Body mass index is 25.45 kg/m.   General: The patient is well-developed and well-nourished and in no acute distress  HEENT:  Head is [place]/AT.  Sclera are anicteric.    Neck: No carotid bruits are noted.  The neck is nontender.  Cardiovascular: The heart has a regular rate and rhythm with a normal S1 and S2. There were no murmurs, gallops or rubs.    Skin: Extremities are without rash or  edema.  Musculoskeletal:  Back is nontender  Neurologic Exam  Mental status: The patient is alert and oriented x 3 at the time of the examination. The  patient has apparent normal recent and remote memory, with an apparently normal attention span and concentration ability.   Speech is normal.  Cranial nerves: Extraocular movements are full. Pupils are equal, round, and reactive to light and accomodation.  Visual fields are full.  Facial symmetry is present. There is good facial sensation to soft touch bilaterally.Facial strength is normal.  Trapezius and sternocleidomastoid strength is normal. No dysarthria is noted.  The tongue is midline, and the patient has symmetric elevation of the soft palate. No obvious hearing deficits are noted.  Motor:  Muscle bulk is normal.   Tone is normal. Strength is normal on the left. In the right arm strength was 4+/5 in the triceps, pronators and finger extensors but 5/5 elsewhere. In the right leg, strength was 4+/5 in the iliopsoas, gastrocnemius and 2/5 in the ankle extensors and 2 -/5 in the toe extensors.  Sensory: Sensation was normal in the left arm and leg. In the right arm, there was reduced sensation in the palmar more than dorsal aspect of the fourth and fifth fingers and adjacent palm greater than third finger. Sensation was normal elsewhere in the hand and arm. In the right leg, he had reduced sensation in the foot dorsum more than the plantar aspect.  Coordination: Cerebellar testing reveals good finger-nose-finger and heel-to-shin was proportionate to strength.  Gait and station: Station is normal.   He had a right foot drop. Tandem was mildly wide.. Romberg is negative.   Reflexes: Deep tendon reflexes are symmetric and normal bilaterally.   Plantar responses are flexor.    DIAGNOSTIC DATA (LABS, IMAGING, TESTING) - I reviewed patient records, labs, notes, testing and imaging myself where available.  Lab Results  Component Value Date   WBC 3.8 03/04/2020   HGB 13.6 03/04/2020   HCT 39.5 03/04/2020   MCV 98.5 03/04/2020   PLT 250 03/04/2020      Component Value Date/Time   NA 140  03/28/2020 1512   K 3.9 03/28/2020 1512   CL 101 03/28/2020 1512   CO2 26 03/28/2020 1512   GLUCOSE 139 03/28/2020 1512   BUN 8 03/28/2020 1512   CREATININE 0.70 03/28/2020 1512   CALCIUM 9.2 03/28/2020 1512   PROT 6.7 03/28/2020 1512   AST 185 (H) 03/28/2020 1512   ALT 94 (H) 03/28/2020  1512   BILITOT 1.2 03/28/2020 1512   GFRNONAA 128 03/28/2020 1512   GFRAA 149 03/28/2020 1512   Lab Results  Component Value Date   CHOL 141 03/04/2020   HDL 44 03/04/2020   LDLCALC 65 03/04/2020   TRIG 308 (H) 03/04/2020   CHOLHDL 3.2 03/04/2020   Lab Results  Component Value Date   HGBA1C 5.2 03/04/2020   No results found for: VITAMINB12 Lab Results  Component Value Date   TSH 0.27 (L) 03/04/2020       ASSESSMENT AND PLAN  Hemiplegic migraine without status migrainosus, not intractable  Right leg weakness - Plan: NCV with EMG(electromyography)  Numbness - Plan: NCV with EMG(electromyography)  Hypertension, unspecified type  Right arm weakness - Plan: NCV with EMG(electromyography)   In summary, Mr. Halbert is a 30 year old man with recent exacerbation of migraine headaches that have been associated with right-sided weakness and numbness. The MRIs of the brain and spinal cord was normal. He did have some foraminal narrowing towards the right in the upper and mid cervical spine but not lower that would go into the right hand. Therefore, I do not think that the cervical and thoracic spine degenerative changes can explain his symptoms. It is probable that he has hemiplegic migraine headaches. Although unusual to have symptoms lasting as long as his, this may occur. Distribution of weakness and numbness is unusual and I cannot rule out either a inherited or acquired multifocal mononeuropathy mostly affecting the right ulnar sensory, right radial motor and right peroneal greater than tibial motor and sensory nerves. We will check an NCV/EMG. If this is normal, a central etiology such  as complicated migraines becomes even more likely. He was most concerned about the possibility of MS. Given the MRI findings, there is no evidence of that diagnosis.  He is currently on a low-dose of amlodipine. I will have him stop the amlodipine and start 180 mg daily verapamil. Many patients with complicated migraines have greatly reduced frequency with this medication. We also going to check genetic labs for the 3 major genes involved in hemiplegic migraine.  He will return to see me for the EMG/NCV study. If it does show multiple mononeuropathies, we will check some additional blood work at that time. He is advised to call sooner if he has new or worsening neurologic symptoms.    Torien Ramroop A. Epimenio Foot, MD, [facility] 04/09/2020, 1:41 PM Certified in Neurology, Clinical Neurophysiology, Sleep Medicine and Neuroimaging  [facility] [address] [place], [place] [address] [phone]

## 2020-04-10 ENCOUNTER — Telehealth: Payer: Self-pay | Admitting: *Deleted

## 2020-04-10 NOTE — Telephone Encounter (Signed)
Faxed completed/signed order form to [facility] at [phone]. Received fax confirmation. Test code: 45. Dx: Hemiplegic Migraine w/o status migrainosus, not intractable.

## 2020-04-16 NOTE — Telephone Encounter (Signed)
[facility] at [phone] and spoke w/ Kathlee Nations. Confirmed they received order form and it is complete. She will push order through and kit could potentially go out today to pt. Nothing further needed at this time.

## 2020-04-17 ENCOUNTER — Telehealth (INDEPENDENT_AMBULATORY_CARE_PROVIDER_SITE_OTHER): Payer: Self-pay

## 2020-04-17 ENCOUNTER — Telehealth (INDEPENDENT_AMBULATORY_CARE_PROVIDER_SITE_OTHER): Payer: BC Managed Care – PPO | Admitting: Nurse Practitioner

## 2020-04-17 ENCOUNTER — Encounter (INDEPENDENT_AMBULATORY_CARE_PROVIDER_SITE_OTHER): Payer: Self-pay | Admitting: Nurse Practitioner

## 2020-04-17 DIAGNOSIS — G43409 Hemiplegic migraine, not intractable, without status migrainosus: Secondary | ICD-10-CM

## 2020-04-17 DIAGNOSIS — R29898 Other symptoms and signs involving the musculoskeletal system: Secondary | ICD-10-CM | POA: Diagnosis not present

## 2020-04-17 DIAGNOSIS — E559 Vitamin D deficiency, unspecified: Secondary | ICD-10-CM

## 2020-04-17 DIAGNOSIS — G4700 Insomnia, unspecified: Secondary | ICD-10-CM

## 2020-04-17 DIAGNOSIS — R748 Abnormal levels of other serum enzymes: Secondary | ICD-10-CM

## 2020-04-17 DIAGNOSIS — I10 Essential (primary) hypertension: Secondary | ICD-10-CM | POA: Diagnosis not present

## 2020-04-17 DIAGNOSIS — R200 Anesthesia of skin: Secondary | ICD-10-CM

## 2020-04-17 NOTE — Telephone Encounter (Signed)
Patient called and left a detailed voice message and stated that he received a package from his neurologist and it is a blood taking kit that has 2 vials, a needle, gloves, etc, and he is not able to take his own blood and wanted to know if we do this test here for gene sequencing?   Please advise.

## 2020-04-17 NOTE — Progress Notes (Signed)
An audio-only tele-health visit was conducted today. I connected with  Jake King on 04/17/20 utilizing audio-only technology and verified that I am speaking with the correct person using two identifiers. The patient was located in their car, and I was located at the office of [facility] during the encounter. I discussed the limitations of evaluation and management by telemedicine. The patient expressed understanding and agreed to proceed.    Subjective:  Patient ID: Jake King, male    DOB: 26-Mar-1990  Age: 30 y.o. MRN: [id number]  CC:  Chief Complaint  Patient presents with  . Hypertension  . Other    Elevated liver enzymes, vitamin D deficiency, weakness  . Headache      HPI  This patient arrives today for virtual visit for the above.  Hypertension: Recently switched from amlodipine to verapamil by a neurologist.  He tells me that first time he took the verapamil he felt very nauseated and ended up vomiting.  Since then he has been taking the medicine with a little bit of food and this has resolved his negative side effects.  He tells me overall he is feeling well on the medication, but he does not have access to a blood pressure cuff so is not been able to monitor his blood pressure at home.  Elevated liver enzymes: So far liver function testing has been normal and hepatitis panels been negative.  GGT was positive.  Ultrasound of liver shows moderate fatty liver.  We did discuss reducing his alcohol intake and he tells me he has made this change.  Instead of drinking daily he is drinking every other day.  Tells me he does have a hard time with sleeping since reducing his alcohol intake.  He will take Benadryl at night as needed.  Tells me he has tried melatonin in the past but this does not help.  He tells me he has a hard time falling asleep, due to not being able to quiet down his thoughts.  He denies feeling anxious.  He denies any caffeine  intake.  Vitamin D deficiency: He continues on his vitamin D3 supplement.  Last serum check was approximately 1 month ago which did show improvement from 22-35.  Weakness/Migraines: He is being evaluated by neurology.  He is due to undergo nerve conduction studies for further evaluation.  As stated above his neurologist has switched him from amlodipine to verapamil for treatment of hypertension as well as for prevention of migraines.  Past Medical History:  Diagnosis Date  . Brain mass   . Migraine   . Right knee injury   . Seizures (HCC)   . Vitamin D deficiency       Family History  Problem Relation Age of Onset  . Other Mother        "Twisted Colon"  . Mental illness Mother   . Diabetes Father   . Hypertension Father   . Pneumonia Father   . Congestive Heart Failure Paternal Grandfather   . Hypertension Paternal Grandfather   . Diabetes Paternal Grandfather   . Multiple sclerosis Paternal Grandfather     Social History   Social History Narrative   Lives with Jake Dom [name]) and their kids   Caffeine use: none   Right handed    Social History   Tobacco Use  . Smoking status: Current Every Day Smoker    Packs/day: 0.50  . Smokeless tobacco: Former Engineer, water Use Topics  . Alcohol use: Yes  Alcohol/week: 2.0 - 3.0 standard drinks    Types: 2 - 3 Cans of beer per week    Comment: 3-4 beers every night      Current Meds  Medication Sig  . Cholecalciferol (VITAMIN D-3) 5000 UNIT/ML LIQD Place 2 mLs under the tongue daily. Total of 10,000IUs daily  . Multiple Vitamins-Minerals (MULTIVITAMIN WITH MINERALS) tablet Take 1 tablet by mouth daily.  . verapamil (CALAN-SR) 180 MG CR tablet Take 1 tablet (180 mg total) by mouth at bedtime.    ROS:  Review of Systems  Respiratory: Negative for shortness of breath.   Cardiovascular: Negative for chest pain.  Gastrointestinal: Positive for heartburn, nausea and vomiting. Negative for abdominal pain.  Neurological:  Negative for dizziness and headaches.  Psychiatric/Behavioral: The patient has insomnia.      Objective:   Today's Vitals: There were no vitals taken for this visit. Vitals with BMI 04/17/2020 04/09/2020 03/28/2020  Height - 5\' 11"  -  Weight - 182 lbs 8 oz -  BMI - 25.46 -  Systolic (No Data) 119 128  Diastolic (No Data) 70 82  Pulse - 92 -     Physical Exam Comprehensive physical exam not completed today as office visit was conducted remotely.  Patient sounded well over the phone.  Patient was alert and oriented, and appeared to have appropriate judgment.       Assessment and Plan   1. Vitamin D deficiency   2. Hypertension, unspecified type   3. Hemiplegic migraine without status migrainosus, not intractable   4. Right leg weakness   5. Numbness   6. Insomnia, unspecified type   7. Elevated liver enzymes      Plan: 1.  He will continue on his vitamin D3 supplement. 2.  He is encouraged to let me know if he has any chest pain, difficulty breathing, blurry vision, or worsening headaches with the change in his medications.  He will follow-up in approximately 1 month at which point we will check CMP as well as check blood pressure. 3.-5.  He will continue to follow-up with neurology as scheduled. 6.  We will discuss further at next office visit, however today we did discuss general sleep hygiene techniques such as avoiding screen time for at least 90 minutes before bedtime, going to sleep and rising in the morning on a consistent schedule, and considering starting a meditation routine prior to bedtime. 7.  We will recheck CMP at next office visit.  I encouraged him to continue with the reduction in alcohol intake.   Tests ordered No orders of the defined types were placed in this encounter.     No orders of the defined types were placed in this encounter.   Patient to follow-up in 1 month or sooner as needed.  Total time from the telephone today was 12 minutes and 22  seconds  , NP

## 2020-04-17 NOTE — Telephone Encounter (Signed)
Called patient and gave him the message and patient stated that he will contact his neurologist office for further instructions. Patient verbalized an understanding and thanked [place].

## 2020-04-17 NOTE — Telephone Encounter (Signed)
I am not sure what this test kit is.  If it came from his neurologist office and I would recommend he call them for further discussion and directions on what to do with the kit.

## 2020-04-23 NOTE — Telephone Encounter (Signed)
[employer] at [phone]. Spoke with [place]. Collection kit sent out to pt on 04/16/20. They have not received back from pt yet.

## 2020-05-01 ENCOUNTER — Telehealth: Payer: Self-pay | Admitting: Neurology

## 2020-05-01 NOTE — Telephone Encounter (Signed)
Called patient back. He has not gotten blood drawn yet. Has not heard from [facility] to schedule lab draw. Advised him to call them back at [phone] to get this set up. He also inquired about cost. Advised he should ask [employer] about this when he calls as well. He verbalized understanding.

## 2020-05-01 NOTE — Telephone Encounter (Signed)
Jake King, Pt was told to get a genetic test and the information was mailed to him. He said someone was supposed to contact him in regards to blood work and he hasn't received a call yet. Thank you

## 2020-05-07 ENCOUNTER — Encounter: Payer: BC Managed Care – PPO | Admitting: Neurology

## 2020-05-07 DIAGNOSIS — Z0289 Encounter for other administrative examinations: Secondary | ICD-10-CM

## 2020-05-09 NOTE — Telephone Encounter (Signed)
Called [facility] to get update. Spoke with [employer]. Collection kit received by pt 3 wks ago. They have not received sample back yet from pt. He will follow up with pt tomorrow.

## 2020-05-20 DIAGNOSIS — G43409 Hemiplegic migraine, not intractable, without status migrainosus: Secondary | ICD-10-CM | POA: Diagnosis not present

## 2020-05-22 NOTE — Telephone Encounter (Signed)
[employer] and spoke with [place]. They received lab sample back from pt 05/21/20. Takes 2-4 wks to process. Should have results middle of April, they will fax results.

## 2020-05-23 ENCOUNTER — Ambulatory Visit (INDEPENDENT_AMBULATORY_CARE_PROVIDER_SITE_OTHER): Payer: BC Managed Care – PPO | Admitting: Nurse Practitioner

## 2020-05-23 ENCOUNTER — Encounter (INDEPENDENT_AMBULATORY_CARE_PROVIDER_SITE_OTHER): Payer: Self-pay | Admitting: Nurse Practitioner

## 2020-06-03 NOTE — Telephone Encounter (Signed)
Received [facility] result: negative. Gave to MD for review.

## 2020-06-04 NOTE — Telephone Encounter (Signed)
Called pt back. Advised genetic testing normal per MD. He has not scheduled EMG/NCS yet. Unable to do Tuesday mornings. He is trying to make arrangements to work this out with GF/kids/mother in Social worker. He will call back once he is able to schedule this.  He also wanted Dr. Epimenio Foot to know verapamil has been effective. Has only had 1 bad migraine since seen. Right leg loss of function/numbness has improved. He is able to lift leg now. He will call back if he has any other questions/concerns.

## 2020-07-02 ENCOUNTER — Encounter: Payer: Self-pay | Admitting: Emergency Medicine

## 2020-07-02 ENCOUNTER — Telehealth (INDEPENDENT_AMBULATORY_CARE_PROVIDER_SITE_OTHER): Payer: Self-pay

## 2020-07-02 ENCOUNTER — Ambulatory Visit
Admission: EM | Admit: 2020-07-02 | Discharge: 2020-07-02 | Disposition: A | Discharge status: Home / Self Care | Payer: BC Managed Care – PPO | Attending: Family Medicine | Admitting: Family Medicine

## 2020-07-02 ENCOUNTER — Other Ambulatory Visit: Payer: Self-pay

## 2020-07-02 DIAGNOSIS — J069 Acute upper respiratory infection, unspecified: Secondary | ICD-10-CM | POA: Diagnosis not present

## 2020-07-02 DIAGNOSIS — J209 Acute bronchitis, unspecified: Secondary | ICD-10-CM

## 2020-07-02 HISTORY — DX: Essential (primary) hypertension: I10

## 2020-07-02 MED ORDER — METHYLPREDNISOLONE SODIUM SUCC 125 MG IJ SOLR
125.0000 mg | Freq: Once | INTRAMUSCULAR | Status: AC
  Administered 2020-07-02: 125 mg via INTRAMUSCULAR

## 2020-07-02 MED ORDER — PREDNISONE 10 MG (21) PO TBPK: ORAL_TABLET | Freq: Every day | ORAL | 0 refills | Status: AC

## 2020-07-02 MED ORDER — ALBUTEROL SULFATE HFA 108 (90 BASE) MCG/ACT IN AERS: 2.0000 | INHALATION_SPRAY | RESPIRATORY_TRACT | 0 refills | Status: DC | PRN

## 2020-07-02 MED ORDER — DOXYCYCLINE HYCLATE 100 MG PO CAPS: 100.0000 mg | ORAL_CAPSULE | Freq: Two times a day (BID) | ORAL | 0 refills | Status: DC

## 2020-07-02 NOTE — ED Provider Notes (Signed)
[facility]   [id number] 07/02/20 Arrival Time: 1135   CC: COVID symptoms  SUBJECTIVE: History from: patient.  Jake King is a 30 y.o. male who presents with cough, wheezing, SOB, chest tightness for the last week. Reports that his R eye is red this morning and that he thinks he did that to himself by coughing so much. Denies sick exposure to COVID, flu or strep. Denies recent travel. Has negative history of Covid. Has not completed Covid vaccines. Has not completed flu vaccine. Has used his albuterol inhaler with little relief. Cough is worse at night and with activity. Denies previous symptoms in the past. Denies fever, chills, sinus pain, rhinorrhea, sore throat, nausea, changes in bowel or bladder habits.    ROS: As per HPI.  All other pertinent ROS negative.     Past Medical History:  Diagnosis Date  . Brain mass   . Hypertension   . Migraine   . Right knee injury   . Seizures (HCC)   . Vitamin D deficiency    Past Surgical History:  Procedure Laterality Date  . NASAL SINUS SURGERY    . NASAL SINUS SURGERY  2009   Allergies  Allergen Reactions  . Atropine Swelling  . Other Swelling    SULFITES IN ALCOHOLS    No current facility-administered medications on file prior to encounter.   Current Outpatient Medications on File Prior to Encounter  Medication Sig Dispense Refill  . Cholecalciferol (VITAMIN D-3) 5000 UNIT/ML LIQD Place 2 mLs under the tongue daily. Total of 10,000IUs daily    . Multiple Vitamins-Minerals (MULTIVITAMIN WITH MINERALS) tablet Take 1 tablet by mouth daily.    . verapamil (CALAN-SR) 180 MG CR tablet Take 1 tablet (180 mg total) by mouth at bedtime. 30 tablet 5   Social History   Socioeconomic History  . Marital status: Significant Other    Spouse name: King Sheldon  . Number of children: 1  . Years of education: GED  . Highest education level: Not on file  Occupational History  . Occupation: [employer]  Tobacco Use   . Smoking status: Current Every Day Smoker    Packs/day: 0.50  . Smokeless tobacco: Former Engineer, water and Sexual Activity  . Alcohol use: Yes    Alcohol/week: 2.0 - 3.0 standard drinks    Types: 2 - 3 Cans of beer per week    Comment: 3-4 beers every night   . Drug use: Not Currently  . Sexual activity: Not on file  Other Topics Concern  . Not on file  Social History Narrative   Lives with Jake King Sheldon) and their kids   Caffeine use: none   Right handed    Social Determinants of Health   Financial Resource Strain: Not on file  Food Insecurity: Not on file  Transportation Needs: Not on file  Physical Activity: Not on file  Stress: Not on file  Social Connections: Not on file  Intimate Partner Violence: Not on file   Family History  Problem Relation Age of Onset  . Other Mother        "Twisted Colon"  . Mental illness Mother   . Diabetes Father   . Hypertension Father   . Pneumonia Father   . Congestive Heart Failure Paternal Grandfather   . Hypertension Paternal Grandfather   . Diabetes Paternal Grandfather   . Multiple sclerosis Paternal Grandfather     OBJECTIVE:  Vitals:   07/02/20 1208  BP: 126/79  Pulse: 65  Resp: 19  Temp: 98 F (36.7 C)  TempSrc: Oral  SpO2: 93%     General appearance: alert; appears fatigued, but nontoxic; speaking in full sentences and tolerating own secretions HEENT: NCAT; Ears: EACs clear, TMs pearly gray; Eyes: PERRL.  EOM grossly intact. Sinuses: nontender; Nose: nares patent with clear rhinorrhea, Throat: oropharynx erythematous, cobblestoning present, tonsils non erythematous or enlarged, uvula midline  Neck: supple without LAD Lungs: unlabored respirations, symmetrical air entry; cough: moderate; no respiratory distress; diffuse wheezing and rhonchi to bilateral lung fields Heart: regular rate and rhythm.  Radial pulses 2+ symmetrical bilaterally Skin: warm and dry Psychological: alert and cooperative; normal mood and  affect  LABS:  No results found for this or any previous visit (from the past 24 hour(s)).   ASSESSMENT & PLAN:  1. URI with cough and congestion   2. Acute bronchitis, unspecified organism     Meds ordered this encounter  Medications  . methylPREDNISolone sodium succinate (SOLU-MEDROL) 125 mg/2 mL injection 125 mg  . albuterol (VENTOLIN HFA) 108 (90 Base) MCG/ACT inhaler    Sig: Inhale 2 puffs into the lungs every 4 (four) hours as needed for wheezing or shortness of breath.    Dispense:  18 g    Refill:  0    Order Specific Question:   Supervising Provider    Answer:   Merrilee Jansky [id number]  . predniSONE (STERAPRED UNI-PAK 21 TAB) 10 MG (21) TBPK tablet    Sig: Take by mouth daily for 6 days. Take 6 tablets on day 1, 5 tablets on day 2, 4 tablets on day 3, 3 tablets on day 4, 2 tablets on day 5, 1 tablet on day 6    Dispense:  21 tablet    Refill:  0    Order Specific Question:   Supervising Provider    Answer:   Merrilee Jansky [id number]  . doxycycline (VIBRAMYCIN) 100 MG capsule    Sig: Take 1 capsule (100 mg total) by mouth 2 (two) times daily.    Dispense:  14 capsule    Refill:  0    Order Specific Question:   Supervising Provider    Answer:   Merrilee Jansky [[id number]]   Solumedrol 125mg  IM in office today Albuterol inhaler 2 puffs in office today Steroid taper prescribed Prescribed doxycycline 100mg  BID x 7 days Continue supportive care at home Work note provided Get plenty of rest and push fluids Use OTC zyrtec for nasal congestion, runny nose, and/or sore throat Use OTC flonase for nasal congestion and runny nose Use medications daily for symptom relief Use OTC medications like ibuprofen or tylenol as needed fever or pain Call or go to the ED if you have any new or worsening symptoms such as fever, worsening cough, shortness of breath, chest tightness, chest pain, turning blue, changes in mental status.  Reviewed expectations re: course of current  medical issues. Questions answered. Outlined signs and symptoms indicating need for more acute intervention. Patient verbalized understanding. After Visit Summary given.         , NP 07/03/20 [phone]

## 2020-07-02 NOTE — ED Triage Notes (Signed)
Redness to RT eye that started Saturday morning. Cough that started last week.  Wakes up coughing every morning

## 2020-07-02 NOTE — Telephone Encounter (Signed)
Patient called and left a detailed voice message that he noticed a blood spot in his eye (did not specify which eye) on Saturday and on Sunday he had a bad headache an thought the blood spot was getting larger.   I called the patient and asked several questions and let him know that we do not have any providers in the office today. Patient did state that he did not have a headache today but he does have a cough. Patient also advised that when it is dark he will see blue lines in the affected eye and swirls and not able to see well out of the affected eye. Patient informed me that he has had an increase of stress at work lately.  I advised for patient to go to [facility] and gave him the location. Patient verbalized an understanding and stated he will go today.

## 2020-07-02 NOTE — Discharge Instructions (Addendum)
I have sent in doxycycline for you to take one tablet twice a day for 10 days  I have sent in a prednisone taper for you to take for 6 days. 6 tablets on day one, 5 tablets on day two, 4 tablets on day three, 3 tablets on day four, 2 tablets on day five, and 1 tablet on day six.  I have sent in an albuterol inhaler for you to use 2 puffs every 4-6 hours as needed for cough, shortness of breath, wheezing.  Follow up with this office or with primary care if symptoms are persisting.  Follow up in the ER for high fever, trouble swallowing, trouble breathing, other concerning symptoms.

## 2020-08-01 ENCOUNTER — Other Ambulatory Visit: Payer: Self-pay

## 2020-08-01 ENCOUNTER — Ambulatory Visit
Admission: EM | Admit: 2020-08-01 | Discharge: 2020-08-01 | Disposition: A | Discharge status: Home / Self Care | Payer: BC Managed Care – PPO | Attending: Emergency Medicine | Admitting: Emergency Medicine

## 2020-08-01 ENCOUNTER — Encounter: Payer: Self-pay | Admitting: Emergency Medicine

## 2020-08-01 DIAGNOSIS — J209 Acute bronchitis, unspecified: Secondary | ICD-10-CM

## 2020-08-01 DIAGNOSIS — R059 Cough, unspecified: Secondary | ICD-10-CM

## 2020-08-01 DIAGNOSIS — R062 Wheezing: Secondary | ICD-10-CM

## 2020-08-01 DIAGNOSIS — J3489 Other specified disorders of nose and nasal sinuses: Secondary | ICD-10-CM

## 2020-08-01 MED ORDER — PREDNISONE 10 MG (21) PO TBPK: ORAL_TABLET | Freq: Every day | ORAL | 0 refills | Status: DC

## 2020-08-01 MED ORDER — AMOXICILLIN-POT CLAVULANATE 875-125 MG PO TABS: 1.0000 | ORAL_TABLET | Freq: Two times a day (BID) | ORAL | 0 refills | Status: DC

## 2020-08-01 MED ORDER — BENZONATATE 100 MG PO CAPS: 100.0000 mg | ORAL_CAPSULE | Freq: Three times a day (TID) | ORAL | 0 refills | Status: DC

## 2020-08-01 MED ORDER — ALBUTEROL SULFATE HFA 108 (90 BASE) MCG/ACT IN AERS
2.0000 | INHALATION_SPRAY | Freq: Once | RESPIRATORY_TRACT | Status: AC
  Administered 2020-08-01: 2 via RESPIRATORY_TRACT

## 2020-08-01 MED ORDER — DEXAMETHASONE SODIUM PHOSPHATE 10 MG/ML IJ SOLN
10.0000 mg | Freq: Once | INTRAMUSCULAR | Status: AC
  Administered 2020-08-01: 10 mg via INTRAMUSCULAR

## 2020-08-01 NOTE — Discharge Instructions (Signed)
Get plenty of rest and push fluids Tessalon Perles prescribed for cough Steroid shot given in office Prednisone prescribed Augmentin for sinus infection Use OTC zyrtec for nasal congestion, runny nose, and/or sore throat Use OTC flonase for nasal congestion and runny nose Use medications daily for symptom relief Use OTC medications like ibuprofen or tylenol as needed fever or pain Follow up with PCP next week for recheck Call or go to the ED if you have any new or worsening symptoms such as fever, worsening cough, shortness of breath, chest tightness, chest pain, turning blue, changes in mental status, etc..Marland Kitchen

## 2020-08-01 NOTE — ED Triage Notes (Signed)
Coughing x 1 week, coughing up brown sputum.  Sinus pressure.

## 2020-08-01 NOTE — ED Provider Notes (Signed)
[facility]   [id number] 08/01/20 Arrival Time: 1920   CC: COVID symptoms  SUBJECTIVE: History from: patient.  Jake King is a 30 y.o. male who presents with fever, tmax 103 at home, sinus pain, pressure, congestion, and productive cough with brown sputum x 1 week.  Denies sick exposure to COVID, flu or strep.  Negative at home covid test.  Denies alleviating or aggravating factors.  Reports previous symptoms 1 month ago, that improved with steroids and antibiotics.   Denies SOB, chest pain, nausea, changes in bowel or bladder habits.    ROS: As per HPI.  All other pertinent ROS negative.     Past Medical History:  Diagnosis Date  . Brain mass   . Hypertension   . Migraine   . Right knee injury   . Seizures (HCC)   . Vitamin D deficiency    Past Surgical History:  Procedure Laterality Date  . NASAL SINUS SURGERY    . NASAL SINUS SURGERY  2009   Allergies  Allergen Reactions  . Atropine Swelling  . Other Swelling    SULFITES IN ALCOHOLS    No current facility-administered medications on file prior to encounter.   Current Outpatient Medications on File Prior to Encounter  Medication Sig Dispense Refill  . albuterol (VENTOLIN HFA) 108 (90 Base) MCG/ACT inhaler Inhale 2 puffs into the lungs every 4 (four) hours as needed for wheezing or shortness of breath. 18 g 0  . Cholecalciferol (VITAMIN D-3) 5000 UNIT/ML LIQD Place 2 mLs under the tongue daily. Total of 10,000IUs daily    . Multiple Vitamins-Minerals (MULTIVITAMIN WITH MINERALS) tablet Take 1 tablet by mouth daily.    . verapamil (CALAN-SR) 180 MG CR tablet Take 1 tablet (180 mg total) by mouth at bedtime. 30 tablet 5   Social History   Socioeconomic History  . Marital status: Significant Other    Spouse name: [name]  . Number of children: 1  . Years of education: GED  . Highest education level: Not on file  Occupational History  . Occupation: [employer]  Tobacco Use  . Smoking  status: Current Every Day Smoker    Packs/day: 0.50  . Smokeless tobacco: Former Engineer, water and Sexual Activity  . Alcohol use: Yes    Alcohol/week: 2.0 - 3.0 standard drinks    Types: 2 - 3 Cans of beer per week    Comment: 3-4 beers every night   . Drug use: Not Currently  . Sexual activity: Not on file  Other Topics Concern  . Not on file  Social History Narrative   Lives with Salvadore Dom [name]) and their kids   Caffeine use: none   Right handed    Social Determinants of Health   Financial Resource Strain: Not on file  Food Insecurity: Not on file  Transportation Needs: Not on file  Physical Activity: Not on file  Stress: Not on file  Social Connections: Not on file  Intimate Partner Violence: Not on file   Family History  Problem Relation Age of Onset  . Other Mother        "Twisted Colon"  . Mental illness Mother   . Diabetes Father   . Hypertension Father   . Pneumonia Father   . Congestive Heart Failure Paternal Grandfather   . Hypertension Paternal Grandfather   . Diabetes Paternal Grandfather   . Multiple sclerosis Paternal Grandfather     OBJECTIVE:  Vitals:   08/01/20 1937  BP: 129/76  Pulse: (!) 116  Resp: 18  Temp: 99.9 F (37.7 C)  TempSrc: Temporal  SpO2: 93%     General appearance: alert; appears fatigued, but nontoxic; speaking in full sentences and tolerating own secretions HEENT: NCAT; Ears: EACs clear, TMs pearly gray; Eyes: PERRL.  EOM grossly intact. Nose: nares patent without rhinorrhea, Throat: oropharynx clear, tonsils non erythematous or enlarged, uvula midline  Neck: supple without LAD Lungs: unlabored respirations, symmetrical air entry; cough: mild; no respiratory distress; wheezes and rhonchi throughout bilateral lung fields Heart: regular rate and rhythm.  Skin: warm and dry Psychological: alert and cooperative; normal mood and affect   ASSESSMENT & PLAN:  1. Sinus pain   2. Cough   3. Acute bronchitis, unspecified  organism   4. Wheezing     Meds ordered this encounter  Medications  . amoxicillin-clavulanate (AUGMENTIN) 875-125 MG tablet    Sig: Take 1 tablet by mouth every 12 (twelve) hours for 10 days.    Dispense:  20 tablet    Refill:  0    Order Specific Question:   Supervising Provider    Answer:   Eustace Moore [[id number]]  . predniSONE (STERAPRED UNI-PAK 21 TAB) 10 MG (21) TBPK tablet    Sig: Take by mouth daily. Take 6 tabs by mouth daily  for 2 days, then 5 tabs for 2 days, then 4 tabs for 2 days, then 3 tabs for 2 days, 2 tabs for 2 days, then 1 tab by mouth daily for 2 days    Dispense:  42 tablet    Refill:  0    Order Specific Question:   Supervising Provider    Answer:   Eustace Moore [[id number]]  . benzonatate (TESSALON) 100 MG capsule    Sig: Take 1 capsule (100 mg total) by mouth every 8 (eight) hours.    Dispense:  21 capsule    Refill:  0    Order Specific Question:   Supervising Provider    Answer:   Eustace Moore [[id number]]  . dexamethasone (DECADRON) injection 10 mg  . albuterol (VENTOLIN HFA) 108 (90 Base) MCG/ACT inhaler 2 puff    Get plenty of rest and push fluids Tessalon Perles prescribed for cough Steroid shot given in office Prednisone prescribed Augmentin for sinus infection Use OTC zyrtec for nasal congestion, runny nose, and/or sore throat Use OTC flonase for nasal congestion and runny nose Use medications daily for symptom relief Use OTC medications like ibuprofen or tylenol as needed fever or pain Follow up with PCP next week for recheck Call or go to the ED if you have any new or worsening symptoms such as fever, worsening cough, shortness of breath, chest tightness, chest pain, turning blue, changes in mental status, etc...   Reviewed expectations re: course of current medical issues. Questions answered. Outlined signs and symptoms indicating need for more acute intervention. Patient verbalized understanding. After Visit Summary  given.         Rennis Harding, PA-C 08/01/20 2029

## 2020-08-29 ENCOUNTER — Other Ambulatory Visit: Payer: Self-pay

## 2020-08-29 ENCOUNTER — Ambulatory Visit (INDEPENDENT_AMBULATORY_CARE_PROVIDER_SITE_OTHER): Payer: BC Managed Care – PPO | Admitting: Internal Medicine

## 2020-08-29 ENCOUNTER — Encounter (INDEPENDENT_AMBULATORY_CARE_PROVIDER_SITE_OTHER): Payer: Self-pay | Admitting: Internal Medicine

## 2020-08-29 VITALS — BP 148/86 | HR 115 | Temp 97.9°F | Resp 17 | Ht 71.0 in | Wt 181.2 lb

## 2020-08-29 DIAGNOSIS — E559 Vitamin D deficiency, unspecified: Secondary | ICD-10-CM

## 2020-08-29 DIAGNOSIS — R21 Rash and other nonspecific skin eruption: Secondary | ICD-10-CM

## 2020-08-29 DIAGNOSIS — R748 Abnormal levels of other serum enzymes: Secondary | ICD-10-CM

## 2020-08-29 DIAGNOSIS — I10 Essential (primary) hypertension: Secondary | ICD-10-CM | POA: Diagnosis not present

## 2020-08-29 DIAGNOSIS — E785 Hyperlipidemia, unspecified: Secondary | ICD-10-CM | POA: Diagnosis not present

## 2020-08-29 DIAGNOSIS — Z13220 Encounter for screening for lipoid disorders: Secondary | ICD-10-CM | POA: Diagnosis not present

## 2020-08-29 LAB — LIPID PANEL
Cholesterol: 151 mg/dL (ref <200)
HDL: 47 mg/dL (ref >=40)
LDL Cholesterol (Calc): 81 mg/dL (calc)
Non-HDL Cholesterol (Calc): 104 mg/dL (calc) (ref <130)
Total CHOL/HDL Ratio: 3.2 (calc) (ref <5.0)
Triglycerides: 136 mg/dL (ref <150)

## 2020-08-29 LAB — COMPLETE METABOLIC PANEL WITH GFR
AG Ratio: 1.4 (calc) (ref 1.0–2.5)
ALT: 89 U/L — ABNORMAL HIGH (ref 9–46)
AST: 147 U/L — ABNORMAL HIGH (ref 10–40)
Albumin: 3.9 g/dL (ref 3.6–5.1)
Alkaline phosphatase (APISO): 134 U/L — ABNORMAL HIGH (ref 36–130)
BUN: 8 mg/dL (ref 7–25)
CO2: 34 mmol/L — ABNORMAL HIGH (ref 20–32)
Calcium: 9.1 mg/dL (ref 8.6–10.3)
Chloride: 97 mmol/L — ABNORMAL LOW (ref 98–110)
Creat: 0.87 mg/dL (ref 0.60–1.35)
GFR, Est African American: 136 mL/min/{1.73_m2} (ref >=60)
GFR, Est Non African American: 117 mL/min/{1.73_m2} (ref >=60)
Globulin: 2.8 g/dL (calc) (ref 1.9–3.7)
Glucose, Bld: 100 mg/dL — ABNORMAL HIGH (ref 65–99)
Potassium: 4.5 mmol/L (ref 3.5–5.3)
Sodium: 139 mmol/L (ref 135–146)
Total Bilirubin: 1.5 mg/dL — ABNORMAL HIGH (ref 0.2–1.2)
Total Protein: 6.7 g/dL (ref 6.1–8.1)

## 2020-08-29 MED ORDER — VERAPAMIL HCL ER 240 MG PO TBCR: 240.0000 mg | EXTENDED_RELEASE_TABLET | Freq: Every day | ORAL | 3 refills | Status: DC

## 2020-08-29 NOTE — Progress Notes (Signed)
Metrics: Intervention Frequency ACO  Documented Smoking Status Yearly  Screened one or more times in 24 months  Cessation Counseling or  Active cessation medication Past 24 months  Past 24 months   Guideline developer: UpToDate (See UpToDate for funding source) Date Released: 2014       Wellness Office Visit  Subjective:  Patient ID: Jake King, male    DOB: January 27, 1991  Age: 30 y.o. MRN: [id number]  CC: This man comes in for follow-up of hypertension, fatty liver disease and abnormal liver enzymes and migraine headaches. HPI  He tells me that since he has been on verapamil through the neurologist, his migraine headaches have completely gone.  He has also reduced alcohol consumption to the point where he is now drinking 2-3 beers every night as opposed to much more that he was before. He continues to smoke cigarettes and he appears to have a chronic cough productive of white sputum every morning.  He has had episodes of bronchitis which is required antibiotics and steroids. Is also complaining of a rash in both lower legs posteriorly and some on his thighs. Past Medical History:  Diagnosis Date   Brain mass    Hypertension    Migraine    Right knee injury    Seizures (HCC)    Vitamin D deficiency    Past Surgical History:  Procedure Laterality Date   NASAL SINUS SURGERY     NASAL SINUS SURGERY  2009     Family History  Problem Relation Age of Onset   Other Mother        "Twisted Colon"   Mental illness Mother    Diabetes Father    Hypertension Father    Pneumonia Father    Congestive Heart Failure Paternal Grandfather    Hypertension Paternal Grandfather    Diabetes Paternal Grandfather    Multiple sclerosis Paternal Grandfather     Social History   Social History Narrative   Lives with Salvadore Dom [name]) and their kids   Caffeine use: none   Right handed    Social History   Tobacco Use   Smoking status: Every Day    Packs/day: 0.50    Pack years:  0.00    Types: Cigarettes   Smokeless tobacco: Former  Substance Use Topics   Alcohol use: Yes    Alcohol/week: 2.0 - 3.0 standard drinks    Types: 2 - 3 Cans of beer per week    Comment: 3-4 beers every night     Current Meds  Medication Sig   albuterol (VENTOLIN HFA) 108 (90 Base) MCG/ACT inhaler Inhale 2 puffs into the lungs every 4 (four) hours as needed for wheezing or shortness of breath.   Cholecalciferol (VITAMIN D-3) 5000 UNIT/ML LIQD Place 2 mLs under the tongue daily. Total of 10,000IUs daily   Multiple Vitamins-Minerals (MULTIVITAMIN WITH MINERALS) tablet Take 1 tablet by mouth daily.   verapamil (CALAN SR) 240 MG CR tablet Take 1 tablet (240 mg total) by mouth at bedtime.   [DISCONTINUED] verapamil (CALAN-SR) 180 MG CR tablet Take 1 tablet (180 mg total) by mouth at bedtime.       Objective:   Today's Vitals: BP (!) 148/86 (BP Location: Right Arm, Patient Position: Sitting, Cuff Size: Normal)   Pulse (!) 115   Temp 97.9 F (36.6 C) (Temporal)   Resp 17   Ht 5\' 11"  (1.803 m)   Wt 181 lb 3.2 oz (82.2 kg)   SpO2 99%   BMI 25.27  kg/m  Vitals with BMI 08/29/2020 08/01/2020 07/02/2020  Height 5\' 11"  - -  Weight 181 lbs 3 oz - -  BMI 25.28 - -  Systolic 148 129  Diastolic 86 76 79  Pulse 115 116 65     Physical Exam He is alert and orientated.  His blood pressure is not well controlled still.  He appears to have a papular rash, erythematous, on the posterior aspect of his lower legs.  I am not sure what these lesions are exactly.      Assessment   1. Hypertension, unspecified type   2. Elevated liver enzymes   3. Vitamin D deficiency   4. Screening for lipoid disorders   5. Skin rash       Tests ordered Orders Placed This Encounter  Procedures   COMPLETE METABOLIC PANEL WITH GFR   Lipid panel   Ambulatory referral to Dermatology      Plan: 1.  His hypertension is not controlled and I am going to increase the verapamil dose to 240 mg at  night.  I have sent a new prescription regarding this. 2.  We will check his liver enzymes and lipid panel today. 3.  I will send him to dermatology for the skin rash. 4.  Follow-up with 354 in about 3 months.    Meds ordered this encounter  Medications   verapamil (CALAN SR) 240 MG CR tablet    Sig: Take 1 tablet (240 mg total) by mouth at bedtime.    Dispense:  30 tablet    Refill:  3     Treasure Ochs Maralyn Sago, MD

## 2020-12-05 ENCOUNTER — Ambulatory Visit (INDEPENDENT_AMBULATORY_CARE_PROVIDER_SITE_OTHER): Payer: BC Managed Care – PPO | Admitting: Nurse Practitioner

## 2021-05-01 ENCOUNTER — Other Ambulatory Visit: Payer: Self-pay

## 2021-05-01 ENCOUNTER — Encounter: Payer: Self-pay | Admitting: Internal Medicine

## 2021-05-01 ENCOUNTER — Ambulatory Visit: Payer: Managed Care, Other (non HMO) | Admitting: Internal Medicine

## 2021-05-01 VITALS — BP 118/84 | HR 107 | Resp 18 | Ht 70.5 in | Wt 183.1 lb

## 2021-05-01 DIAGNOSIS — Z114 Encounter for screening for human immunodeficiency virus [HIV]: Secondary | ICD-10-CM

## 2021-05-01 DIAGNOSIS — E559 Vitamin D deficiency, unspecified: Secondary | ICD-10-CM

## 2021-05-01 DIAGNOSIS — Z720 Tobacco use: Secondary | ICD-10-CM

## 2021-05-01 DIAGNOSIS — E782 Mixed hyperlipidemia: Secondary | ICD-10-CM

## 2021-05-01 DIAGNOSIS — R7401 Elevation of levels of liver transaminase levels: Secondary | ICD-10-CM

## 2021-05-01 DIAGNOSIS — F10288 Alcohol dependence with other alcohol-induced disorder: Secondary | ICD-10-CM

## 2021-05-01 DIAGNOSIS — I10 Essential (primary) hypertension: Secondary | ICD-10-CM | POA: Diagnosis not present

## 2021-05-01 DIAGNOSIS — Z2821 Immunization not carried out because of patient refusal: Secondary | ICD-10-CM

## 2021-05-01 DIAGNOSIS — G43119 Migraine with aura, intractable, without status migrainosus: Secondary | ICD-10-CM | POA: Diagnosis not present

## 2021-05-01 MED ORDER — VERAPAMIL HCL ER 120 MG PO TBCR
120.0000 mg | EXTENDED_RELEASE_TABLET | Freq: Every day | ORAL | 1 refills | Status: DC
Start: 2021-05-01 — End: 2021-10-17

## 2021-05-01 MED ORDER — VERAPAMIL HCL ER 240 MG PO TBCR
240.0000 mg | EXTENDED_RELEASE_TABLET | Freq: Every day | ORAL | 5 refills | Status: DC
Start: 2021-05-01 — End: 2021-05-01

## 2021-05-01 MED ORDER — ALBUTEROL SULFATE HFA 108 (90 BASE) MCG/ACT IN AERS: 2.0000 | INHALATION_SPRAY | RESPIRATORY_TRACT | 5 refills | Status: AC | PRN

## 2021-05-01 NOTE — Patient Instructions (Signed)
Please start taking Verapamil 120 mg dose.  Please try to cut down alcohol use and smoking.

## 2021-05-02 DIAGNOSIS — R7401 Elevation of levels of liver transaminase levels: Secondary | ICD-10-CM | POA: Insufficient documentation

## 2021-05-02 DIAGNOSIS — F10288 Alcohol dependence with other alcohol-induced disorder: Secondary | ICD-10-CM | POA: Insufficient documentation

## 2021-05-02 DIAGNOSIS — E782 Mixed hyperlipidemia: Secondary | ICD-10-CM | POA: Insufficient documentation

## 2021-05-02 LAB — CBC WITH DIFFERENTIAL/PLATELET
Basophils Absolute: 0 10*3/uL (ref 0.0–0.2)
Basos: 1 %
EOS (ABSOLUTE): 0.4 10*3/uL (ref 0.0–0.4)
Eos: 8 %
Hematocrit: 43.8 % (ref 37.5–51.0)
Hemoglobin: 15.2 g/dL (ref 13.0–17.7)
Immature Grans (Abs): 0 10*3/uL (ref 0.0–0.1)
Immature Granulocytes: 0 %
Lymphocytes Absolute: 1.8 10*3/uL (ref 0.7–3.1)
Lymphs: 38 %
MCH: 35.1 pg — ABNORMAL HIGH (ref 26.6–33.0)
MCHC: 34.7 g/dL (ref 31.5–35.7)
MCV: 101 fL — ABNORMAL HIGH (ref 79–97)
Monocytes Absolute: 0.4 10*3/uL (ref 0.1–0.9)
Monocytes: 8 %
Neutrophils Absolute: 2.2 10*3/uL (ref 1.4–7.0)
Neutrophils: 45 %
Platelets: 216 10*3/uL (ref 150–450)
RBC: 4.33 x10E6/uL (ref 4.14–5.80)
RDW: 14.8 % (ref 11.6–15.4)
WBC: 4.8 10*3/uL (ref 3.4–10.8)

## 2021-05-02 LAB — CMP14+EGFR
ALT: 73 IU/L — ABNORMAL HIGH (ref 0–44)
AST: 198 IU/L — ABNORMAL HIGH (ref 0–40)
Albumin/Globulin Ratio: 1.3 (ref 1.2–2.2)
Albumin: 4 g/dL — ABNORMAL LOW (ref 4.1–5.2)
Alkaline Phosphatase: 182 IU/L — ABNORMAL HIGH (ref 44–121)
BUN/Creatinine Ratio: 6 — ABNORMAL LOW (ref 9–20)
BUN: 5 mg/dL — ABNORMAL LOW (ref 6–20)
Bilirubin Total: 2.3 mg/dL — ABNORMAL HIGH (ref 0.0–1.2)
CO2: 28 mmol/L (ref 20–29)
Calcium: 8.9 mg/dL (ref 8.7–10.2)
Chloride: 99 mmol/L (ref 96–106)
Creatinine, Ser: 0.78 mg/dL (ref 0.76–1.27)
Globulin, Total: 3.1 g/dL (ref 1.5–4.5)
Glucose: 90 mg/dL (ref 70–99)
Potassium: 4.6 mmol/L (ref 3.5–5.2)
Sodium: 141 mmol/L (ref 134–144)
Total Protein: 7.1 g/dL (ref 6.0–8.5)
eGFR: 124 mL/min/{1.73_m2} (ref >59)

## 2021-05-02 LAB — HIV ANTIBODY (ROUTINE TESTING W REFLEX): HIV Screen 4th Generation wRfx: NONREACTIVE

## 2021-05-02 LAB — LIPID PANEL
Chol/HDL Ratio: 7.2 ratio — ABNORMAL HIGH (ref 0.0–5.0)
Cholesterol, Total: 180 mg/dL (ref 100–199)
HDL: 25 mg/dL — ABNORMAL LOW (ref >39)
LDL Chol Calc (NIH): 118 mg/dL — ABNORMAL HIGH (ref 0–99)
Triglycerides: 208 mg/dL — ABNORMAL HIGH (ref 0–149)
VLDL Cholesterol Cal: 37 mg/dL (ref 5–40)

## 2021-05-02 NOTE — Assessment & Plan Note (Signed)
Advised to follow low cholesterol diet for now Not candidate for statin due to his transaminitis for now

## 2021-05-02 NOTE — Assessment & Plan Note (Addendum)
Likely due to alcohol abuse Reports that he has decreased alcohol intake now, takes 2 cans of beer every day now, used to take 6 cans/day in the past Will check CMP [place] RUQ showed hepatic steatosis in the past.

## 2021-05-02 NOTE — Assessment & Plan Note (Signed)
BP Readings from Last 1 Encounters:  05/01/21 118/84   Well-controlled now Restarted Verapamil at a lower dose Counseled for compliance with the medications Advised DASH diet and moderate exercise/walking, at least 150 mins/week

## 2021-05-02 NOTE — Assessment & Plan Note (Signed)
Has cut down alcohol use Advised to avoid daily intake due to his history of transaminitis

## 2021-05-02 NOTE — Assessment & Plan Note (Signed)
Takes vitamin D supplement 

## 2021-05-02 NOTE — Assessment & Plan Note (Signed)
Smokes about 0.5 pack/day  Asked about quitting: confirms that he/she currently smokes cigarettes Advise to quit smoking: Educated about QUITTING to reduce the risk of cancer, cardio and cerebrovascular disease. Assess willingness: Unwilling to quit at this time, but is working on cutting back. Assist with counseling and pharmacotherapy: Counseled for 5 minutes and literature provided. Arrange for follow up: follow up in 3 months and continue to offer help. 

## 2021-05-02 NOTE — Assessment & Plan Note (Signed)
Well-controlled with Verapamil now Was started on Verapamil 180 mg QD dose, then increased to 240 mg due to HTN Now BP better even without Verapamil Will decrease dose to 120 mg QD now Needs follow up with Neurology

## 2021-05-02 NOTE — Progress Notes (Signed)
New Patient Office Visit  Subjective:  Patient ID: Jake King, male    DOB: Apr 24, 1990  Age: 31 y.o. MRN: [id number]  CC:  Chief Complaint  Patient presents with   New Patient (Initial Visit)    New patient was seeing dr Anastasio Champion pt needs medication refills     HPI Jake King is a 31 y.o. male with past medical history of migraine, chronic leg weakness, HLD and alcohol abuse who presents for establishing care.  He was placed on verapamil for his migraine.  He has been feeling better with it.  He has seen Dr. Felecia Shelling for it.  His chronic arm and leg weakness is thought to be due to complicated migraine.  He was planned to have NCS, but has not been able to schedule visit for it yet.  He has run out of his verapamil now.  He has history of HTN and is dose of metformin was increased to 240 mg daily due to uncontrolled HTN.  His BP is well controlled currently even without it.  He denies any chest pain, dyspnea or palpitations currently.  He recently had an eye injury at his workplace, for which he is seeing ophthalmologist and has had foreign body removal procedures.  He had conjunctivitis after it and had to get membrane surgery recently.  He currently denies visual disturbance.  He still has eye redness and intermittent eye pain.  Past Medical History:  Diagnosis Date   Brain mass    Hypertension    Migraine    Right knee injury    Seizures ([place])    Vitamin D deficiency     Past Surgical History:  Procedure Laterality Date   NASAL SINUS SURGERY     NASAL SINUS SURGERY  2009    Family History  Problem Relation Age of Onset   Other Mother        "Twisted Colon"   Mental illness Mother    Diabetes Father    Hypertension Father    Pneumonia Father    Congestive Heart Failure Paternal Grandfather    Hypertension Paternal Grandfather    Diabetes Paternal Grandfather    Multiple sclerosis Paternal Grandfather     Social History   Socioeconomic  History   Marital status: Significant Other    Spouse name: Caryl Pina   Number of children: 1   Years of education: GED   Highest education level: Not on file  Occupational History   Occupation: [employer]  Tobacco Use   Smoking status: Every Day    Packs/day: 0.50    Types: Cigarettes   Smokeless tobacco: Former  Substance and Sexual Activity   Alcohol use: Yes    Alcohol/week: 2.0 - 3.0 standard drinks    Types: 2 - 3 Cans of beer per week    Comment: 3-4 beers every night    Drug use: Not Currently   Sexual activity: Not on file  Other Topics Concern   Not on file  Social History Narrative   Lives with Lillia Corporal Caryl Pina) and their kids   Caffeine use: none   Right handed    Social Determinants of Health   Financial Resource Strain: Not on file  Food Insecurity: Not on file  Transportation Needs: Not on file  Physical Activity: Not on file  Stress: Not on file  Social Connections: Not on file  Intimate Partner Violence: Not on file    ROS Review of Systems  Constitutional:  Negative for chills and  fever.  HENT:  Negative for congestion and sore throat.   Eyes:  Positive for pain and redness.  Respiratory:  Negative for cough and shortness of breath.   Cardiovascular:  Negative for chest pain and palpitations.  Gastrointestinal:  Negative for constipation, diarrhea, nausea and vomiting.  Endocrine: Negative for polydipsia and polyuria.  Genitourinary:  Negative for dysuria and hematuria.  Musculoskeletal:  Negative for neck pain and neck stiffness.  Skin:  Negative for rash.  Neurological:  Positive for weakness and headaches. Negative for dizziness and numbness.  Psychiatric/Behavioral:  Negative for agitation and behavioral problems.    Objective:   Today's Vitals: BP 118/84 (BP Location: Right Arm, Patient Position: Sitting, Cuff Size: Normal)    Pulse (!) 107    Resp 18    Ht 5' 10.5" (1.791 m)    Wt 183 lb 1.9 oz (83.1 kg)    SpO2 96%    BMI 25.90 kg/m    Physical Exam Vitals reviewed.  Constitutional:      General: He is not in acute distress.    Appearance: He is not diaphoretic.  HENT:     Head: Normocephalic and atraumatic.     Nose: Nose normal.     Mouth/Throat:     Mouth: Mucous membranes are moist.  Eyes:     General: No scleral icterus.    Extraocular Movements: Extraocular movements intact.  Cardiovascular:     Rate and Rhythm: Normal rate and regular rhythm.     Pulses: Normal pulses.     Heart sounds: Normal heart sounds. No murmur heard. Pulmonary:     Breath sounds: Normal breath sounds. No wheezing or rales.  Abdominal:     Palpations: Abdomen is soft.     Tenderness: There is no abdominal tenderness.  Musculoskeletal:     Cervical back: Neck supple. No tenderness.     Right lower leg: No edema.     Left lower leg: No edema.  Skin:    General: Skin is warm.     Findings: No rash.  Neurological:     General: No focal deficit present.     Mental Status: He is alert and oriented to person, place, and time.  Psychiatric:        Mood and Affect: Mood normal.        Behavior: Behavior normal.    Assessment & Plan:   Problem List Items Addressed This Visit       Cardiovascular and Mediastinum   Intractable migraine with aura without status migrainosus - Primary    Well-controlled with Verapamil now Was started on Verapamil 180 mg QD dose, then increased to 240 mg due to HTN Now BP better even without Verapamil Will decrease dose to 120 mg QD now Needs follow up with Neurology      Relevant Medications   verapamil (CALAN-SR) 120 MG CR tablet   Hypertension    BP Readings from Last 1 Encounters:  05/01/21 118/84  Well-controlled now Restarted Verapamil at a lower dose Counseled for compliance with the medications Advised DASH diet and moderate exercise/walking, at least 150 mins/week      Relevant Medications   verapamil (CALAN-SR) 120 MG CR tablet     Other   Vitamin D deficiency    Takes  vitamin D supplement      Tobacco abuse    Smokes about 0.5 pack/day  Asked about quitting: confirms that he/she currently smokes cigarettes Advise to quit smoking: Educated about QUITTING to  reduce the risk of cancer, cardio and cerebrovascular disease. Assess willingness: Unwilling to quit at this time, but is working on cutting back. Assist with counseling and pharmacotherapy: Counseled for 5 minutes and literature provided. Arrange for follow up: follow up in 3 months and continue to offer help.      Relevant Medications   albuterol (VENTOLIN HFA) 108 (90 Base) MCG/ACT inhaler   Transaminitis    Likely due to alcohol abuse Reports that he has decreased alcohol intake now, takes 2 cans of beer every day now, used to take 6 cans/day in the past Will check CMP [place] RUQ showed hepatic steatosis in the past.      Relevant Orders   CMP14+EGFR (Completed)   Alcohol dependence with other alcohol-induced disorder ([place])    Has cut down alcohol use Advised to avoid daily intake due to his history of transaminitis      Relevant Orders   CBC with Differential/Platelet (Completed)   Mixed hyperlipidemia    Advised to follow low cholesterol diet for now Not candidate for statin due to his transaminitis for now      Relevant Medications   verapamil (CALAN-SR) 120 MG CR tablet   Other Relevant Orders   Lipid Profile (Completed)   Other Visit Diagnoses     Encounter for screening for HIV       Relevant Orders   HIV antibody (with reflex) (Completed)   Refused influenza vaccine           Outpatient Encounter Medications as of 05/01/2021  Medication Sig   Cholecalciferol (VITAMIN D) 50 MCG (2000 UT) CAPS Take 1 tablet by mouth daily.   Multiple Vitamins-Minerals (MULTIVITAMIN WITH MINERALS) tablet Take 1 tablet by mouth daily.   verapamil (CALAN-SR) 120 MG CR tablet Take 1 tablet (120 mg total) by mouth at bedtime.   [DISCONTINUED] Cholecalciferol (VITAMIN D-3) 5000 UNIT/ML LIQD  Place 2 mLs under the tongue daily. Total of 10,000IUs daily   albuterol (VENTOLIN HFA) 108 (90 Base) MCG/ACT inhaler Inhale 2 puffs into the lungs every 4 (four) hours as needed for wheezing or shortness of breath.   [DISCONTINUED] albuterol (VENTOLIN HFA) 108 (90 Base) MCG/ACT inhaler Inhale 2 puffs into the lungs every 4 (four) hours as needed for wheezing or shortness of breath. (Patient not taking: Reported on 05/01/2021)   [DISCONTINUED] benzonatate (TESSALON) 100 MG capsule Take 1 capsule (100 mg total) by mouth every 8 (eight) hours. (Patient not taking: Reported on 08/29/2020)   [DISCONTINUED] predniSONE (STERAPRED UNI-PAK 21 TAB) 10 MG (21) TBPK tablet Take by mouth daily. Take 6 tabs by mouth daily  for 2 days, then 5 tabs for 2 days, then 4 tabs for 2 days, then 3 tabs for 2 days, 2 tabs for 2 days, then 1 tab by mouth daily for 2 days (Patient not taking: Reported on 08/29/2020)   [DISCONTINUED] verapamil (CALAN SR) 240 MG CR tablet Take 1 tablet (240 mg total) by mouth at bedtime. (Patient not taking: Reported on 05/01/2021)   [DISCONTINUED] verapamil (CALAN SR) 240 MG CR tablet Take 1 tablet (240 mg total) by mouth at bedtime.   No facility-administered encounter medications on file as of 05/01/2021.    Follow-up: Return in about 4 months (around 08/29/2021) for Elevated liver enzymes.   Lindell Spar, MD

## 2021-05-07 ENCOUNTER — Other Ambulatory Visit: Payer: Self-pay | Admitting: *Deleted

## 2021-05-07 DIAGNOSIS — R748 Abnormal levels of other serum enzymes: Secondary | ICD-10-CM

## 2021-05-15 ENCOUNTER — Encounter: Payer: Self-pay | Admitting: *Deleted

## 2021-05-15 ENCOUNTER — Other Ambulatory Visit: Payer: Self-pay

## 2021-05-15 ENCOUNTER — Ambulatory Visit: Payer: Managed Care, Other (non HMO) | Admitting: Gastroenterology

## 2021-05-15 ENCOUNTER — Telehealth: Payer: Self-pay | Admitting: *Deleted

## 2021-05-15 ENCOUNTER — Encounter: Payer: Self-pay | Admitting: Gastroenterology

## 2021-05-15 DIAGNOSIS — K219 Gastro-esophageal reflux disease without esophagitis: Secondary | ICD-10-CM | POA: Diagnosis not present

## 2021-05-15 DIAGNOSIS — R1310 Dysphagia, unspecified: Secondary | ICD-10-CM | POA: Diagnosis not present

## 2021-05-15 DIAGNOSIS — R7989 Other specified abnormal findings of blood chemistry: Secondary | ICD-10-CM

## 2021-05-15 MED ORDER — PANTOPRAZOLE SODIUM 40 MG PO TBEC
40.0000 mg | DELAYED_RELEASE_TABLET | Freq: Every day | ORAL | 3 refills | Status: DC
Start: 2021-05-15 — End: 2021-06-12

## 2021-05-15 NOTE — Telephone Encounter (Signed)
PA approved via evicore. Auth#  [id number], DOS: 06/10/2021-11/11/2021 ?

## 2021-05-15 NOTE — H&P (View-Only) (Signed)
? ? ? ? ? ?Gastroenterology Office Note   ? ?Referring Provider: Lindell Spar, MD ?Primary Care Physician:  Lindell Spar, MD  ?Primary GI: Dr. Abbey Chatters ? ? ? ?Chief Complaint  ? ?Chief Complaint  ?Patient presents with  ? Elevated Hepatic Enzymes  ? ? ? ?History of Present Illness  ? ?Jake King is a 31 y.o. male presenting today at the request of Lindell Spar, MD due to ?Elevated LFTs.  ? ?He has had elevated LFTs dating back to 2021, with AST predominantly more elevated than ALT. Most recent with AST 190, ALT 73, Tbili 2.3, , alk phos 182. AST greater than 4 times upper imits of normal, and ALT approximately 2 times upper limits of normal. Albumin 4.  ? ?HIV negative. Dyslipidemia with Triglycerides 208, HDL low at 25, LDL high at 118. In 2022, Hep C antibody negative, Hep B serologies all negative. GGT elevated.  ? ?[place] Jan 2022: moderate steatosis.  ? ?Drinks most days of weeks. About 4 beers each evening.  ? ?Has heartburn. Reflux with water. Has tried OTC. +dysphagia. LUQ pain chronically. Coughs every morning waking. Had shooting pain in epigastric area a few days ago after coughing. Feels like his eyes have been yellow in the past. Decreased appetite due to GERD. Difficulty drinking water as it will come back up.  ? ?No confusion or mental status changes. 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  ? ? ? ?Past Medical History:  ?Diagnosis Date  ? 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 Hypertension   ? Migraine   ? Right knee injury   ? Seizures ([place])   ? Vitamin D deficiency   ? ? ?Past Surgical History:  ?Procedure Laterality Date  ? NASAL SINUS SURGERY    ? NASAL SINUS SURGERY  2009  ? ? ?Current Outpatient Medications  ?Medication Sig Dispense Refill  ? albuterol (VENTOLIN HFA) 108 (90 Base) MCG/ACT inhaler Inhale 2 puffs into the lungs every 4 (four) hours as needed for wheezing or shortness of breath. 18 g 5  ? Cholecalciferol (VITAMIN D) 50 MCG (2000 UT) CAPS Take 1 tablet by mouth  daily.    ? Multiple Vitamins-Minerals (MULTIVITAMIN WITH MINERALS) tablet Take 1 tablet by mouth daily.    ? pantoprazole (PROTONIX) 40 MG tablet Take 1 tablet (40 mg total) by mouth daily. Take 30 minutes before breakfast 90 tablet 3  ? verapamil (CALAN-SR) 120 MG CR tablet Take 1 tablet (120 mg total) by mouth at bedtime. 90 tablet 1  ? ?No current facility-administered medications for this visit.  ? ? ?Allergies as of 05/15/2021 - Review Complete 05/15/2021  ?Allergen Reaction Noted  ? Atropine Swelling 03/04/2020  ? Other Swelling 07/02/2020  ? ? ?Family History  ?Problem Relation Age of Onset  ? Other Mother   ?     "Twisted Colon"  ? Mental illness Mother   ? Diabetes Father   ? Hypertension Father   ? Pneumonia Father   ? Liver disease Father   ?     unsure what  ? Ulcerative colitis Paternal Grandmother   ? Colon polyps Paternal Grandmother   ? Congestive Heart Failure Paternal Grandfather   ? Hypertension Paternal Grandfather   ? Diabetes Paternal Grandfather   ? Multiple sclerosis Paternal Grandfather   ? Colon cancer Neg Hx   ? ? ?Social History  ? ?Socioeconomic History  ? Marital status: Significant Other  ?  Spouse name: Caryl Pina  ? Number of children: 1  ? Years of education: GED  ? Highest education level: Not on file  ?Occupational History  ? Occupation: [employer]  ?Tobacco Use  ? Smoking status: Every Day  ?  Packs/day: 0.50  ?  Types: Cigarettes  ? Smokeless tobacco: Former  ?Substance and Sexual Activity  ? Alcohol use: Yes  ?  Alcohol/week: 2.0 - 3.0 standard drinks  ?  Types: 2 - 3 Cans of beer per week  ?  Comment: 3-4 beers every night   ? Drug use: Not Currently  ?  Comment: no drugs since 2018  ? Sexual activity: Not on file  ?Other Topics Concern  ? Not on file  ?Social History Narrative  ? Lives with Lillia Corporal Caryl Pina) and their kids  ? Caffeine use: none  ? Right handed   ? ?Social Determinants of Health  ? ?Financial Resource Strain: Not on file  ?Food Insecurity: Not on file   ?Transportation Needs: Not on file  ?Physical Activity: Not on file  ?Stress: Not on file  ?Social Connections: Not on file  ?Intimate Partner Violence: Not on file  ? ? ? ?Review of Systems  ? ?Gen: Denies any fever, chills, fatigue, weight loss, lack of appetite.  ?CV: Denies chest pain, heart palpitations, peripheral edema, syncope.  ?Resp: +cough, phlegmn in mornings  ?GI: see HPI ?GU : Denies urinary burning, urinary frequency, urinary hesitancy ?MS: Denies joint pain, muscle weakness, cramps, or limitation of movement.  ?Derm: Denies rash, itching, dry skin ?Psych: Denies depression, anxiety, memory loss, and confusion ?Heme: Denies bruising, bleeding, and enlarged lymph nodes. ? ? ?Physical Exam  ? ?BP 110/70   Pulse 92   Temp 97.8 ?F (36.6 ?C)   Ht 5' 11"  (1.803 m)   Wt 188 lb 3.2 oz (85.4 kg)   BMI 26.25 kg/m?  ?General:   Alert and oriented. Pleasant and cooperative. Well-nourished and well-developed.  ?Head:  Normocephalic and atraumatic. ?Eyes:  Without icterus ?Ears:  Normal auditory acuity. ?Lungs:  Clear to auscultation bilaterally.  ?Heart:  S1, S2 present without murmurs appreciated.  ?Abdomen:  +BS, soft, TTP epigastric. Liver margin palpable below right costal margin, about 3-4 finger breadths and non-distended. TTP RUQ. No masses appreciated.  ?Rectal:  Deferred  ?Msk:  Symmetrical without gross deformities. Normal posture. ?Extremities:  Without edema. ?Neurologic:  Alert and  oriented x4;  grossly normal neurologically. ?Skin:  Intact without significant lesions or rashes. ?Psych:  Alert and cooperative. Normal mood and affect. ? ? ?Assessment  ? ?Jake King is a 31 y.o. male presenting today with Jake King is a 31 y.o. male presenting today at the request of Lindell Spar, MD due to elevated LFTs. He also reports GERD and dysphagia today.  ? ?Elevated LFTs: dating back to 2021, with AST predominantly more elevated than ALT. Most recent AST greater than 4 times  upper imits of normal, and ALT approximately 2 times upper limits of normal. In 2022, Hep C antibody negative, Hep B serologies all negative. GGT elevated. Likely element of alcoholic hepatitis. Drinks about 4 beers each evening, most nights of the week. Known fatty liver. Father with some type of liver disease but unknown. He has dyslipidemia as well. We discussed avoidance of alcohol due to likely progression of cirrhosis. I do note hepatomegaly on exam and TTP RUQ. Will pursue extensive serologies now, US abdomen complete, and recommending complete abstinence from alcohol. He can work with  his PCP for dyslipidemia management.  ? ?Dysphagia: likely due to uncontrolled GERD. Query possible stricture. Unable to rule out EOE. Start pantoprazole daily and pursue EGD/dilation.  ? ? ? ?PLAN  ? ?Proceed with upper endoscopy/dilation , +/- esophageal biopsies for EOE by Dr. Abbey Chatters in near future: the risks, benefits, and alternatives have been discussed with the patient in detail. The patient states understanding and desires to proceed.  ? ?Start pantoprazole once daily ? ?Extensive serologies for elevated LFTs ordered ? ?US abdomen complete ? ?ETOH cessation ? ?Return in follow-up thereafter ? ? ? ?Annitta Needs, PhD, ANP-BC ?[facility]  ? ? ?

## 2021-05-15 NOTE — Patient Instructions (Signed)
Please complete blood work today. ? ?I have also ordered an ultrasound of your liver and spleen. ? ?We are arranging an upper endoscopy with dilation by Dr. Marletta Lor. ? ?I have sent in pantoprazole (Protonix) to take once each morning, 30 minutes before breakfast. This is for reflux. It may take up to 14 days to take effect. ? ?It is very important to stop drinking to protect your liver. Continuing to drink may lead to scarring/cirrhosis of the liver, which can't be reversed. ? ?We will see you in follow-up! ? ?It was a pleasure to see you today. I want to create trusting relationships with patients to provide genuine, compassionate, and quality care. I value your feedback. If you receive a survey regarding your visit,  I greatly appreciate you taking time to fill this out.  ? ?Gelene Mink, PhD, ANP-BC ?[facility]  ? ?

## 2021-05-15 NOTE — Progress Notes (Signed)
? ? ? ? ? ?Gastroenterology Office Note   ? ?Referring Provider: Lindell Spar, MD ?Primary Care Physician:  Lindell Spar, MD  ?Primary GI: Dr. Abbey Chatters ? ? ? ?Chief Complaint  ? ?Chief Complaint  ?Patient presents with  ? Elevated Hepatic Enzymes  ? ? ? ?History of Present Illness  ? ?Jake King is a 31 y.o. male presenting today at the request of Lindell Spar, MD due to ?Elevated LFTs.  ? ?He has had elevated LFTs dating back to 2021, with AST predominantly more elevated than ALT. Most recent with AST 190, ALT 73, Tbili 2.3, , alk phos 182. AST greater than 4 times upper imits of normal, and ALT approximately 2 times upper limits of normal. Albumin 4.  ? ?HIV negative. Dyslipidemia with Triglycerides 208, HDL low at 25, LDL high at 118. In 2022, Hep C antibody negative, Hep B serologies all negative. GGT elevated.  ? ?[place] Jan 2022: moderate steatosis.  ? ?Drinks most days of weeks. About 4 beers each evening.  ? ?Has heartburn. Reflux with water. Has tried OTC. +dysphagia. LUQ pain chronically. Coughs every morning waking. Had shooting pain in epigastric area a few days ago after coughing. Feels like his eyes have been yellow in the past. Decreased appetite due to GERD. Difficulty drinking water as it will come back up.  ? ?No confusion or mental status changes. 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  ? ? ? ?Past Medical History:  ?Diagnosis Date  ? 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 Hypertension   ? Migraine   ? Right knee injury   ? Seizures ([place])   ? Vitamin D deficiency   ? ? ?Past Surgical History:  ?Procedure Laterality Date  ? NASAL SINUS SURGERY    ? NASAL SINUS SURGERY  2009  ? ? ?Current Outpatient Medications  ?Medication Sig Dispense Refill  ? albuterol (VENTOLIN HFA) 108 (90 Base) MCG/ACT inhaler Inhale 2 puffs into the lungs every 4 (four) hours as needed for wheezing or shortness of breath. 18 g 5  ? Cholecalciferol (VITAMIN D) 50 MCG (2000 UT) CAPS Take 1 tablet by mouth  daily.    ? Multiple Vitamins-Minerals (MULTIVITAMIN WITH MINERALS) tablet Take 1 tablet by mouth daily.    ? pantoprazole (PROTONIX) 40 MG tablet Take 1 tablet (40 mg total) by mouth daily. Take 30 minutes before breakfast 90 tablet 3  ? verapamil (CALAN-SR) 120 MG CR tablet Take 1 tablet (120 mg total) by mouth at bedtime. 90 tablet 1  ? ?No current facility-administered medications for this visit.  ? ? ?Allergies as of 05/15/2021 - Review Complete 05/15/2021  ?Allergen Reaction Noted  ? Atropine Swelling 03/04/2020  ? Other Swelling 07/02/2020  ? ? ?Family History  ?Problem Relation Age of Onset  ? Other Mother   ?     "Twisted Colon"  ? Mental illness Mother   ? Diabetes Father   ? Hypertension Father   ? Pneumonia Father   ? Liver disease Father   ?     unsure what  ? Ulcerative colitis Paternal Grandmother   ? Colon polyps Paternal Grandmother   ? Congestive Heart Failure Paternal Grandfather   ? Hypertension Paternal Grandfather   ? Diabetes Paternal Grandfather   ? Multiple sclerosis Paternal Grandfather   ? Colon cancer Neg Hx   ? ? ?Social History  ? ?Socioeconomic History  ? Marital status: Significant Other  ?  Spouse name: Ashley  ? Number of children: 1  ? Years of education: GED  ? Highest education level: Not on file  ?Occupational History  ? Occupation: [employer]  ?Tobacco Use  ? Smoking status: Every Day  ?  Packs/day: 0.50  ?  Types: Cigarettes  ? Smokeless tobacco: Former  ?Substance and Sexual Activity  ? Alcohol use: Yes  ?  Alcohol/week: 2.0 - 3.0 standard drinks  ?  Types: 2 - 3 Cans of beer per week  ?  Comment: 3-4 beers every night   ? Drug use: Not Currently  ?  Comment: no drugs since 2018  ? Sexual activity: Not on file  ?Other Topics Concern  ? Not on file  ?Social History Narrative  ? Lives with GF (Ashley) and their kids  ? Caffeine use: none  ? Right handed   ? ?Social Determinants of Health  ? ?Financial Resource Strain: Not on file  ?Food Insecurity: Not on file   ?Transportation Needs: Not on file  ?Physical Activity: Not on file  ?Stress: Not on file  ?Social Connections: Not on file  ?Intimate Partner Violence: Not on file  ? ? ? ?Review of Systems  ? ?Gen: Denies any fever, chills, fatigue, weight loss, lack of appetite.  ?CV: Denies chest pain, heart palpitations, peripheral edema, syncope.  ?Resp: +cough, phlegmn in mornings  ?GI: see HPI ?GU : Denies urinary burning, urinary frequency, urinary hesitancy ?MS: Denies joint pain, muscle weakness, cramps, or limitation of movement.  ?Derm: Denies rash, itching, dry skin ?Psych: Denies depression, anxiety, memory loss, and confusion ?Heme: Denies bruising, bleeding, and enlarged lymph nodes. ? ? ?Physical Exam  ? ?BP 110/70   Pulse 92   Temp 97.8 ?F (36.6 ?C)   Ht 5' 11" (1.803 m)   Wt 188 lb 3.2 oz (85.4 kg)   BMI 26.25 kg/m?  ?General:   Alert and oriented. Pleasant and cooperative. Well-nourished and well-developed.  ?Head:  Normocephalic and atraumatic. ?Eyes:  Without icterus ?Ears:  Normal auditory acuity. ?Lungs:  Clear to auscultation bilaterally.  ?Heart:  S1, S2 present without murmurs appreciated.  ?Abdomen:  +BS, soft, TTP epigastric. Liver margin palpable below right costal margin, about 3-4 finger breadths and non-distended. TTP RUQ. No masses appreciated.  ?Rectal:  Deferred  ?Msk:  Symmetrical without gross deformities. Normal posture. ?Extremities:  Without edema. ?Neurologic:  Alert and  oriented x4;  grossly normal neurologically. ?Skin:  Intact without significant lesions or rashes. ?Psych:  Alert and cooperative. Normal mood and affect. ? ? ?Assessment  ? ?Jake King is a 30 y.o. male presenting today with Jake King is a 30 y.o. male presenting today at the request of Patel, Rutwik K, MD due to elevated LFTs. He also reports GERD and dysphagia today.  ? ?Elevated LFTs: dating back to 2021, with AST predominantly more elevated than ALT. Most recent AST greater than 4 times  upper imits of normal, and ALT approximately 2 times upper limits of normal. In 2022, Hep C antibody negative, Hep B serologies all negative. GGT elevated. Likely element of alcoholic hepatitis. Drinks about 4 beers each evening, most nights of the week. Known fatty liver. Father with some type of liver disease but unknown. He has dyslipidemia as well. We discussed avoidance of alcohol due to likely progression of cirrhosis. I do note hepatomegaly on exam and TTP RUQ. Will pursue extensive serologies now, US abdomen complete, and recommending complete abstinence from alcohol. He can work with   his PCP for dyslipidemia management.  ? ?Dysphagia: likely due to uncontrolled GERD. Query possible stricture. Unable to rule out EOE. Start pantoprazole daily and pursue EGD/dilation.  ? ? ? ?PLAN  ? ?Proceed with upper endoscopy/dilation , +/- esophageal biopsies for EOE by Dr. Abbey Chatters in near future: the risks, benefits, and alternatives have been discussed with the patient in detail. The patient states understanding and desires to proceed.  ? ?Start pantoprazole once daily ? ?Extensive serologies for elevated LFTs ordered ? ?US abdomen complete ? ?ETOH cessation ? ?Return in follow-up thereafter ? ? ? ?Annitta Needs, PhD, ANP-BC ?[facility]  ? ? ?

## 2021-05-22 ENCOUNTER — Ambulatory Visit (HOSPITAL_COMMUNITY): Admission: RE | Admit: 2021-05-22 | Payer: Managed Care, Other (non HMO) | Source: Ambulatory Visit

## 2021-05-24 LAB — MITOCHONDRIAL ANTIBODIES: Mitochondrial M2 Ab, IgG: <=20 U (ref <=20.0)

## 2021-05-24 LAB — ANA: Anti Nuclear Antibody (ANA): NEGATIVE

## 2021-05-24 LAB — HEPATIC FUNCTION PANEL
AG Ratio: 1.2 (calc) (ref 1.0–2.5)
ALT: 66 U/L — ABNORMAL HIGH (ref 9–46)
AST: 162 U/L — ABNORMAL HIGH (ref 10–40)
Albumin: 3.7 g/dL (ref 3.6–5.1)
Alkaline phosphatase (APISO): 145 U/L — ABNORMAL HIGH (ref 36–130)
Bilirubin, Direct: 1 mg/dL — ABNORMAL HIGH (ref 0.0–0.2)
Globulin: 3 g/dL (calc) (ref 1.9–3.7)
Indirect Bilirubin: 1.2 mg/dL (calc) (ref 0.2–1.2)
Total Bilirubin: 2.2 mg/dL — ABNORMAL HIGH (ref 0.2–1.2)
Total Protein: 6.7 g/dL (ref 6.1–8.1)

## 2021-05-24 LAB — IRON,TIBC AND FERRITIN PANEL
%SAT: 85 % (calc) — ABNORMAL HIGH (ref 20–48)
Ferritin: 757 ng/mL — ABNORMAL HIGH (ref 38–380)
Iron: 239 ug/dL — ABNORMAL HIGH (ref 50–195)
TIBC: 282 mcg/dL (calc) (ref 250–425)

## 2021-05-24 LAB — ALPHA-1 ANTITRYPSIN PHENOTYPE: A-1 Antitrypsin, Ser: 153 mg/dL (ref 83–199)

## 2021-05-24 LAB — IGG, IGA, IGM
IgG (Immunoglobin G), Serum: 1629 mg/dL (ref 600–1640)
IgM, Serum: 82 mg/dL (ref 50–300)
Immunoglobulin A: 479 mg/dL — ABNORMAL HIGH (ref 47–310)

## 2021-05-24 LAB — CERULOPLASMIN: Ceruloplasmin: 24 mg/dL (ref 18–36)

## 2021-05-24 LAB — ANTI-SMOOTH MUSCLE ANTIBODY, IGG: Actin (Smooth Muscle) Antibody (IGG): <20 U (ref <20)

## 2021-05-24 LAB — PROTIME-INR
INR: 1.1
Prothrombin Time: 11.3 s (ref 9.0–11.5)

## 2021-05-28 ENCOUNTER — Other Ambulatory Visit: Payer: Self-pay

## 2021-05-28 ENCOUNTER — Ambulatory Visit (HOSPITAL_COMMUNITY)
Admission: RE | Admit: 2021-05-28 | Discharge: 2021-05-28 | Disposition: A | Discharge status: Home / Self Care | Payer: Managed Care, Other (non HMO) | Source: Ambulatory Visit | Attending: Gastroenterology | Admitting: Gastroenterology

## 2021-05-28 DIAGNOSIS — R7989 Other specified abnormal findings of blood chemistry: Secondary | ICD-10-CM

## 2021-05-28 DIAGNOSIS — K219 Gastro-esophageal reflux disease without esophagitis: Secondary | ICD-10-CM

## 2021-06-10 ENCOUNTER — Encounter (HOSPITAL_COMMUNITY): Payer: Self-pay

## 2021-06-10 ENCOUNTER — Other Ambulatory Visit: Payer: Self-pay

## 2021-06-10 ENCOUNTER — Ambulatory Visit (HOSPITAL_BASED_OUTPATIENT_CLINIC_OR_DEPARTMENT_OTHER): Payer: Managed Care, Other (non HMO) | Admitting: Anesthesiology

## 2021-06-10 ENCOUNTER — Ambulatory Visit (HOSPITAL_COMMUNITY): Payer: Managed Care, Other (non HMO) | Admitting: Anesthesiology

## 2021-06-10 ENCOUNTER — Encounter (HOSPITAL_COMMUNITY)
Admission: RE | Disposition: A | Discharge status: Home / Self Care | Payer: Self-pay | Source: Home / Self Care | Attending: Internal Medicine

## 2021-06-10 ENCOUNTER — Ambulatory Visit (HOSPITAL_COMMUNITY)
Admission: RE | Admit: 2021-06-10 | Discharge: 2021-06-10 | Disposition: A | Discharge status: Home / Self Care | Payer: Managed Care, Other (non HMO) | Attending: Internal Medicine | Admitting: Internal Medicine

## 2021-06-10 DIAGNOSIS — K2289 Other specified disease of esophagus: Secondary | ICD-10-CM

## 2021-06-10 DIAGNOSIS — E785 Hyperlipidemia, unspecified: Secondary | ICD-10-CM | POA: Insufficient documentation

## 2021-06-10 DIAGNOSIS — R112 Nausea with vomiting, unspecified: Secondary | ICD-10-CM | POA: Insufficient documentation

## 2021-06-10 DIAGNOSIS — K219 Gastro-esophageal reflux disease without esophagitis: Secondary | ICD-10-CM | POA: Insufficient documentation

## 2021-06-10 DIAGNOSIS — R7989 Other specified abnormal findings of blood chemistry: Secondary | ICD-10-CM | POA: Insufficient documentation

## 2021-06-10 DIAGNOSIS — I10 Essential (primary) hypertension: Secondary | ICD-10-CM | POA: Insufficient documentation

## 2021-06-10 DIAGNOSIS — R1310 Dysphagia, unspecified: Secondary | ICD-10-CM

## 2021-06-10 DIAGNOSIS — F17200 Nicotine dependence, unspecified, uncomplicated: Secondary | ICD-10-CM | POA: Diagnosis not present

## 2021-06-10 HISTORY — PX: ESOPHAGOGASTRODUODENOSCOPY (EGD) WITH PROPOFOL: SHX5813

## 2021-06-10 HISTORY — PX: BIOPSY: SHX5522

## 2021-06-10 SURGERY — ESOPHAGOGASTRODUODENOSCOPY (EGD) WITH PROPOFOL
Anesthesia: General

## 2021-06-10 MED ORDER — LIDOCAINE HCL (CARDIAC) PF 100 MG/5ML IV SOSY
PREFILLED_SYRINGE | INTRAVENOUS | Status: DC | PRN
  Administered 2021-06-10: 50 mg via INTRAVENOUS

## 2021-06-10 MED ORDER — PROPOFOL 10 MG/ML IV BOLUS
INTRAVENOUS | Status: DC | PRN
  Administered 2021-06-10: 120 mg via INTRAVENOUS
  Administered 2021-06-10 (×2): 30 mg via INTRAVENOUS

## 2021-06-10 MED ORDER — LACTATED RINGERS IV SOLN: INTRAVENOUS | Status: DC

## 2021-06-10 NOTE — Transfer of Care (Signed)
Immediate Anesthesia Transfer of Care Note ? ?Patient: Jake King ? ?Procedure(s) Performed: ESOPHAGOGASTRODUODENOSCOPY (EGD) WITH PROPOFOL ?BIOPSY ? ?Patient Location: Endoscopy Unit ? ?Anesthesia Type:General ? ?Level of Consciousness: awake ? ?Airway & Oxygen Therapy: Patient Spontanous Breathing ? ?Post-op Assessment: Report given to RN and Post -op Vital signs reviewed and stable ? ?Post vital signs: Reviewed and stable ? ?Last Vitals:  ?Vitals Value Taken Time  ?BP    ?Temp    ?Pulse 94   ?Resp    ?SpO2 96%   ? ? ?Last Pain:  ?Vitals:  ? 06/10/21 1042  ?TempSrc:   ?PainSc: 5   ?   ? ?Patients Stated Pain Goal: 8 (06/10/21 0955) ? ?Complications: No notable events documented. ?

## 2021-06-10 NOTE — Anesthesia Postprocedure Evaluation (Signed)
Anesthesia Post Note ? ?Patient: [name] ? ?Procedure(s) Performed: ESOPHAGOGASTRODUODENOSCOPY (EGD) WITH PROPOFOL ?BIOPSY ? ?Patient location during evaluation: Phase II ?Anesthesia Type: General ?Level of consciousness: awake ?Pain management: pain level controlled ?Vital Signs Assessment: post-procedure vital signs reviewed and stable ?Respiratory status: spontaneous breathing and respiratory function stable ?Cardiovascular status: blood pressure returned to baseline and stable ?Postop Assessment: no headache and no apparent nausea or vomiting ?Anesthetic complications: no ?Comments: Late entry ? ? ?No notable events documented. ? ? ?Last Vitals:  ?Vitals:  ? 06/10/21 0955 06/10/21 1055  ?BP: 124/79 100/70  ?Pulse: 76 95  ?Resp: 16 20  ?Temp: 37 ?C 36.7 ?C  ?SpO2: 96% 96%  ?  ?Last Pain:  ?Vitals:  ? 06/10/21 1055  ?TempSrc: Oral  ?PainSc: 0-No pain  ? ? ?  ?  ?  ?  ?  ?  ? ?Jake King ? ? ? ? ?

## 2021-06-10 NOTE — Op Note (Signed)
[facility] ?Patient Name: Jake King ?Procedure Date: 06/10/2021 10:38 AM ?MRN: [id number] ?Date of Birth: 13-Jul-1990 ?Attending MD: Elon Alas. Abbey Chatters , DO ?CSN: [id number] ?Age: 31 ?Admit Type: Outpatient ?Procedure:                Upper GI endoscopy ?Indications:              Dysphagia, Nausea, Regurgitation ?Providers:                Elon Alas. Abbey Chatters, DO, Caprice Kluver, Raphael Gibney,  ?                          Technician ?Referring MD:              ?Medicines:                See the Anesthesia note for documentation of the  ?                          administered medications ?Complications:            No immediate complications. ?Estimated Blood Loss:     Estimated blood loss was minimal. ?Procedure:                Pre-Anesthesia Assessment: ?                          - The anesthesia plan was to use monitored  ?                          anesthesia care (MAC). ?                          After obtaining informed consent, the endoscope was  ?                          passed under direct vision. Throughout the  ?                          procedure, the patient's blood pressure, pulse, and  ?                          oxygen saturations were monitored continuously. The  ?                          GIF-[id number] ([id number]) scope was introduced through the  ?                          mouth, and advanced to the second part of duodenum.  ?                          The upper GI endoscopy was accomplished without  ?                          difficulty. The patient tolerated the procedure  ?                          well. ?Scope In: 10:45:43 AM ?Scope  Out: 10:52:03 AM ?Total Procedure Duration: 0 hours 6 minutes 20 seconds  ?Findings: ?     Mucosal changes including ringed esophagus and white plaques were found  ?     in the middle third of the esophagus. Biopsies were taken with a cold  ?     forceps for histology. ?     The entire examined stomach was normal. ?     The duodenal bulb, first portion of the duodenum  and second portion of  ?     the duodenum were normal. ?Impression:               - Esophageal mucosal changes suggestive of  ?                          eosinophilic esophagitis. Biopsied. ?                          - Normal stomach. ?                          - Normal duodenal bulb, first portion of the  ?                          duodenum and second portion of the duodenum. ?Moderate Sedation: ?     Per Anesthesia Care ?Recommendation:           - Patient has a contact number available for  ?                          emergencies. The signs and symptoms of potential  ?                          delayed complications were discussed with the  ?                          patient. Return to normal activities tomorrow.  ?                          Written discharge instructions were provided to the  ?                          patient. ?                          - Resume previous diet. ?                          - Continue present medications. ?                          - Await pathology results. ?                          - Repeat upper endoscopy after studies are complete  ?                          for surveillance based on pathology results. ?                          -  Return to GI clinic in 4 months. ?                          - Use Protonix (pantoprazole) 40 mg PO daily. May  ?                          increase to twice daily pending path results. ?Procedure Code(s):        --- Professional --- ?                          [phone], Esophagogastroduodenoscopy, flexible,  ?                          transoral; with biopsy, single or multiple ?Diagnosis Code(s):        --- Professional --- ?                          K22.8, Other specified diseases of esophagus ?                          R13.10, Dysphagia, unspecified ?                          R11.0, Nausea ?                          R11.10, Vomiting, unspecified ?CPT copyright 2019 American Medical Association. All rights reserved. ?The codes documented in this report are  preliminary and upon coder review may  ?be revised to meet current compliance requirements. ?Elon Alas. Abbey Chatters, DO ?Elon Alas. [place], DO ?06/10/2021 10:55:45 AM ?This report has been signed electronically. ?Number of Addenda: 0 ?

## 2021-06-10 NOTE — Interval H&P Note (Signed)
History and Physical Interval Note: ? ?06/10/2021 ?10:14 AM ? ?Jake King  has presented today for surgery, with the diagnosis of dysphagia, gerd.  The various methods of treatment have been discussed with the patient and family. After consideration of risks, benefits and other options for treatment, the patient has consented to  Procedure(s) with comments: ?ESOPHAGOGASTRODUODENOSCOPY (EGD) WITH PROPOFOL (N/A) - 11:15am, ASA 2 ?BALLOON DILATION (N/A) as a surgical intervention.  The patient's history has been reviewed, patient examined, no change in status, stable for surgery.  I have reviewed the patient's chart and labs.  Questions were answered to the patient's satisfaction.   ? ? ?Lanelle Bal ? ? ?

## 2021-06-10 NOTE — Discharge Instructions (Addendum)
EGD ?Discharge instructions ?Please read the instructions outlined below and refer to this sheet in the next few weeks. These discharge instructions provide you with general information on caring for yourself after you leave the hospital. Your doctor may also give you specific instructions. While your treatment has been planned according to the most current medical practices available, unavoidable complications occasionally occur. If you have any problems or questions after discharge, please call your doctor. ?ACTIVITY ?You may resume your regular activity but move at a slower pace for the next 24 hours.  ?Take frequent rest periods for the next 24 hours.  ?Walking will help expel (get rid of) the air and reduce the bloated feeling in your abdomen.  ?No driving for 24 hours (because of the anesthesia (medicine) used during the test).  ?You may shower.  ?Do not sign any important legal documents or operate any machinery for 24 hours (because of the anesthesia used during the test).  ?NUTRITION ?Drink plenty of fluids.  ?You may resume your normal diet.  ?Begin with a light meal and progress to your normal diet.  ?Avoid alcoholic beverages for 24 hours or as instructed by your caregiver.  ?MEDICATIONS ?You may resume your normal medications unless your caregiver tells you otherwise.  ?WHAT YOU CAN EXPECT TODAY ?You may experience abdominal discomfort such as a feeling of fullness or ?gas? pains.  ?FOLLOW-UP ?Your doctor will discuss the results of your test with you.  ?SEEK IMMEDIATE MEDICAL ATTENTION IF ANY OF THE FOLLOWING OCCUR: ?Excessive nausea (feeling sick to your stomach) and/or vomiting.  ?Severe abdominal pain and distention (swelling).  ?Trouble swallowing.  ?Temperature over 101? F (37.8? C).  ?Rectal bleeding or vomiting of blood.  ? ?Your upper endoscopy showed findings suspicious for eosinophilic esophagitis.  This is an allergy mediated process that affects your esophagus.  I took samples of your  esophagus.  Await pathology results my office will contact you.  Your stomach and small bowel appeared normal.  Continue on pantoprazole daily.  We may need to increase this pending pathology results.  There is no obvious stricture on today's exam so I elected not to dilate your esophagus.  Follow up with Tobi Bastos Thursday June 29th at 9:30am at [facility]. ? ? ?I hope you have a great rest of your week! ? ?Hennie Duos. Marletta Lor, D.O. ?Gastroenterology and Hepatology ?[facility] ? ?

## 2021-06-10 NOTE — Anesthesia Preprocedure Evaluation (Signed)
Anesthesia Evaluation  ?Patient identified by MRN, date of birth, ID band ?Patient awake ? ? ? ?Reviewed: ?Allergy & Precautions, H&P , NPO status , Patient's Chart, lab work & pertinent test results, reviewed documented beta blocker date and time  ? ?Airway ?Mallampati: II ? ?TM Distance: >3 FB ?Neck ROM: full ? ? ? Dental ?no notable dental hx. ? ?  ?Pulmonary ?neg pulmonary ROS, Current Smoker,  ?  ?Pulmonary exam normal ?breath sounds clear to auscultation ? ? ? ? ? ? Cardiovascular ?Exercise Tolerance: Good ?hypertension, negative cardio ROS ? ? ?Rhythm:regular Rate:Normal ? ? ?  ?Neuro/Psych ? Headaches, Seizures -,  negative psych ROS  ? GI/Hepatic ?Neg liver ROS, GERD  Medicated,  ?Endo/Other  ?negative endocrine ROS ? Renal/GU ?negative Renal ROS  ?negative genitourinary ?  ?Musculoskeletal ? ? Abdominal ?  ?Peds ? Hematology ?negative hematology ROS ?(+)   ?Anesthesia Other Findings ? ? Reproductive/Obstetrics ?negative OB ROS ? ?  ? ? ? ? ? ? ? ? ? ? ? ? ? ?  ?  ? ? ? ? ? ? ? ? ?Anesthesia Physical ?Anesthesia Plan ? ?ASA: 2 ? ?Anesthesia Plan: General  ? ?Post-op Pain Management:   ? ?Induction:  ? ?PONV Risk Score and Plan: Propofol infusion ? ?Airway Management Planned:  ? ?Additional Equipment:  ? ?Intra-op Plan:  ? ?Post-operative Plan:  ? ?Informed Consent: I have reviewed the patients History and Physical, chart, labs and discussed the procedure including the risks, benefits and alternatives for the proposed anesthesia with the patient or authorized representative who has indicated his/her understanding and acceptance.  ? ? ? ?Dental Advisory Given ? ?Plan Discussed with: CRNA ? ?Anesthesia Plan Comments:   ? ? ? ? ? ? ?Anesthesia Quick Evaluation ? ?

## 2021-06-11 LAB — SURGICAL PATHOLOGY

## 2021-06-12 ENCOUNTER — Telehealth: Payer: Self-pay | Admitting: Internal Medicine

## 2021-06-12 MED ORDER — PANTOPRAZOLE SODIUM 40 MG PO TBEC: 40.0000 mg | DELAYED_RELEASE_TABLET | Freq: Two times a day (BID) | ORAL | 5 refills | Status: DC

## 2021-06-12 NOTE — Telephone Encounter (Signed)
Pantoprazole sent to [facility]. ?

## 2021-06-13 ENCOUNTER — Encounter (HOSPITAL_COMMUNITY): Payer: Self-pay | Admitting: Internal Medicine

## 2021-07-10 ENCOUNTER — Inpatient Hospital Stay (HOSPITAL_COMMUNITY)
Admission: EM | Admit: 2021-07-10 | Discharge: 2021-07-13 | DRG: 432 | Disposition: A | Discharge status: Home / Self Care | Payer: Managed Care, Other (non HMO) | Attending: Internal Medicine | Admitting: Internal Medicine

## 2021-07-10 ENCOUNTER — Ambulatory Visit: Payer: Managed Care, Other (non HMO) | Admitting: Internal Medicine

## 2021-07-10 ENCOUNTER — Other Ambulatory Visit: Payer: Self-pay

## 2021-07-10 ENCOUNTER — Encounter (HOSPITAL_COMMUNITY): Payer: Self-pay | Admitting: *Deleted

## 2021-07-10 ENCOUNTER — Emergency Department (HOSPITAL_COMMUNITY): Payer: Managed Care, Other (non HMO)

## 2021-07-10 ENCOUNTER — Encounter: Payer: Self-pay | Admitting: Internal Medicine

## 2021-07-10 VITALS — BP 126/60 | HR 109 | Temp 98.0°F | Ht 70.0 in | Wt 194.0 lb

## 2021-07-10 DIAGNOSIS — Z818 Family history of other mental and behavioral disorders: Secondary | ICD-10-CM | POA: Diagnosis not present

## 2021-07-10 DIAGNOSIS — Z833 Family history of diabetes mellitus: Secondary | ICD-10-CM | POA: Diagnosis not present

## 2021-07-10 DIAGNOSIS — Z79899 Other long term (current) drug therapy: Secondary | ICD-10-CM | POA: Diagnosis not present

## 2021-07-10 DIAGNOSIS — I10 Essential (primary) hypertension: Secondary | ICD-10-CM | POA: Diagnosis present

## 2021-07-10 DIAGNOSIS — E559 Vitamin D deficiency, unspecified: Secondary | ICD-10-CM | POA: Diagnosis present

## 2021-07-10 DIAGNOSIS — Z8249 Family history of ischemic heart disease and other diseases of the circulatory system: Secondary | ICD-10-CM | POA: Diagnosis not present

## 2021-07-10 DIAGNOSIS — K7031 Alcoholic cirrhosis of liver with ascites: Secondary | ICD-10-CM | POA: Diagnosis present

## 2021-07-10 DIAGNOSIS — K7010 Alcoholic hepatitis without ascites: Secondary | ICD-10-CM

## 2021-07-10 DIAGNOSIS — F17210 Nicotine dependence, cigarettes, uncomplicated: Secondary | ICD-10-CM | POA: Diagnosis present

## 2021-07-10 DIAGNOSIS — K7011 Alcoholic hepatitis with ascites: Principal | ICD-10-CM | POA: Diagnosis present

## 2021-07-10 DIAGNOSIS — K219 Gastro-esophageal reflux disease without esophagitis: Secondary | ICD-10-CM | POA: Diagnosis present

## 2021-07-10 DIAGNOSIS — F10288 Alcohol dependence with other alcohol-induced disorder: Secondary | ICD-10-CM | POA: Diagnosis present

## 2021-07-10 DIAGNOSIS — R17 Unspecified jaundice: Principal | ICD-10-CM

## 2021-07-10 DIAGNOSIS — K852 Alcohol induced acute pancreatitis without necrosis or infection: Secondary | ICD-10-CM | POA: Diagnosis present

## 2021-07-10 DIAGNOSIS — Z720 Tobacco use: Secondary | ICD-10-CM | POA: Diagnosis not present

## 2021-07-10 DIAGNOSIS — R140 Abdominal distension (gaseous): Secondary | ICD-10-CM

## 2021-07-10 DIAGNOSIS — D696 Thrombocytopenia, unspecified: Secondary | ICD-10-CM | POA: Diagnosis present

## 2021-07-10 DIAGNOSIS — R1013 Epigastric pain: Secondary | ICD-10-CM

## 2021-07-10 DIAGNOSIS — Z820 Family history of epilepsy and other diseases of the nervous system: Secondary | ICD-10-CM

## 2021-07-10 DIAGNOSIS — R1011 Right upper quadrant pain: Secondary | ICD-10-CM | POA: Diagnosis present

## 2021-07-10 HISTORY — DX: Inflammatory liver disease, unspecified: K75.9

## 2021-07-10 LAB — COMPREHENSIVE METABOLIC PANEL
ALT: 55 U/L — ABNORMAL HIGH (ref 0–44)
AST: 155 U/L — ABNORMAL HIGH (ref 15–41)
Albumin: 2.2 g/dL — ABNORMAL LOW (ref 3.5–5.0)
Alkaline Phosphatase: 149 U/L — ABNORMAL HIGH (ref 38–126)
Anion gap: 8 (ref 5–15)
BUN: <5 mg/dL — ABNORMAL LOW (ref 6–20)
CO2: 26 mmol/L (ref 22–32)
Calcium: 8.1 mg/dL — ABNORMAL LOW (ref 8.9–10.3)
Chloride: 100 mmol/L (ref 98–111)
Creatinine, Ser: 0.52 mg/dL — ABNORMAL LOW (ref 0.61–1.24)
GFR, Estimated: >60 mL/min (ref >60)
Glucose, Bld: 106 mg/dL — ABNORMAL HIGH (ref 70–99)
Potassium: 3.6 mmol/L (ref 3.5–5.1)
Sodium: 134 mmol/L — ABNORMAL LOW (ref 135–145)
Total Bilirubin: 10.6 mg/dL — ABNORMAL HIGH (ref 0.3–1.2)
Total Protein: 5.9 g/dL — ABNORMAL LOW (ref 6.5–8.1)

## 2021-07-10 LAB — CBC WITH DIFFERENTIAL/PLATELET
Abs Immature Granulocytes: 0.01 10*3/uL (ref 0.00–0.07)
Basophils Absolute: 0 10*3/uL (ref 0.0–0.1)
Basophils Relative: 1 %
Eosinophils Absolute: 0.1 10*3/uL (ref 0.0–0.5)
Eosinophils Relative: 2 %
HCT: 27.6 % — ABNORMAL LOW (ref 39.0–52.0)
Hemoglobin: 10.1 g/dL — ABNORMAL LOW (ref 13.0–17.0)
Immature Granulocytes: 0 %
Lymphocytes Relative: 21 %
Lymphs Abs: 0.9 10*3/uL (ref 0.7–4.0)
MCH: 38.4 pg — ABNORMAL HIGH (ref 26.0–34.0)
MCHC: 36.6 g/dL — ABNORMAL HIGH (ref 30.0–36.0)
MCV: 104.9 fL — ABNORMAL HIGH (ref 80.0–100.0)
Monocytes Absolute: 0.4 10*3/uL (ref 0.1–1.0)
Monocytes Relative: 10 %
Neutro Abs: 2.7 10*3/uL (ref 1.7–7.7)
Neutrophils Relative %: 66 %
Platelets: 140 10*3/uL — ABNORMAL LOW (ref 150–400)
RBC: 2.63 MIL/uL — ABNORMAL LOW (ref 4.22–5.81)
RDW: 18.9 % — ABNORMAL HIGH (ref 11.5–15.5)
WBC: 4.2 10*3/uL (ref 4.0–10.5)
nRBC: 0 % (ref 0.0–0.2)

## 2021-07-10 LAB — PROTIME-INR
INR: 1.4 — ABNORMAL HIGH (ref 0.8–1.2)
Prothrombin Time: 17.1 seconds — ABNORMAL HIGH (ref 11.4–15.2)

## 2021-07-10 LAB — LIPASE, BLOOD: Lipase: 92 U/L — ABNORMAL HIGH (ref 11–51)

## 2021-07-10 LAB — AMMONIA: Ammonia: 37 umol/L — ABNORMAL HIGH (ref 9–35)

## 2021-07-10 LAB — URINALYSIS, ROUTINE W REFLEX MICROSCOPIC
Glucose, UA: NEGATIVE mg/dL
Hgb urine dipstick: NEGATIVE
Ketones, ur: NEGATIVE mg/dL
Leukocytes,Ua: NEGATIVE
Nitrite: NEGATIVE
Protein, ur: NEGATIVE mg/dL
Specific Gravity, Urine: 1.009 (ref 1.005–1.030)
pH: 8 (ref 5.0–8.0)

## 2021-07-10 LAB — APTT: aPTT: 38 seconds — ABNORMAL HIGH (ref 24–36)

## 2021-07-10 LAB — VITAMIN B12: Vitamin B-12: 999 pg/mL — ABNORMAL HIGH (ref 180–914)

## 2021-07-10 LAB — FOLATE: Folate: 2 ng/mL — ABNORMAL LOW (ref >5.9)

## 2021-07-10 MED ORDER — ACETAMINOPHEN 325 MG PO TABS
650.0000 mg | ORAL_TABLET | Freq: Four times a day (QID) | ORAL | Status: DC | PRN
  Administered 2021-07-12 – 2021-07-13 (×2): 650 mg via ORAL
  Filled 2021-07-10 (×2): qty 2

## 2021-07-10 MED ORDER — IOHEXOL 300 MG/ML  SOLN
100.0000 mL | Freq: Once | INTRAMUSCULAR | Status: AC | PRN
  Administered 2021-07-10: 100 mL via INTRAVENOUS

## 2021-07-10 MED ORDER — ONDANSETRON HCL 4 MG/2ML IJ SOLN
4.0000 mg | Freq: Once | INTRAMUSCULAR | Status: AC
  Administered 2021-07-10: 4 mg via INTRAVENOUS
  Filled 2021-07-10: qty 2

## 2021-07-10 MED ORDER — ONDANSETRON HCL 4 MG PO TABS: 4.0000 mg | ORAL_TABLET | Freq: Four times a day (QID) | ORAL | Status: DC | PRN

## 2021-07-10 MED ORDER — SODIUM CHLORIDE 0.9 % IV SOLN: INTRAVENOUS | Status: DC

## 2021-07-10 MED ORDER — LORAZEPAM 1 MG PO TABS: 1.0000 mg | ORAL_TABLET | ORAL | Status: DC | PRN

## 2021-07-10 MED ORDER — FENTANYL CITRATE PF 50 MCG/ML IJ SOSY
50.0000 ug | PREFILLED_SYRINGE | Freq: Once | INTRAMUSCULAR | Status: AC
  Administered 2021-07-10: 50 ug via INTRAVENOUS
  Filled 2021-07-10: qty 1

## 2021-07-10 MED ORDER — PANTOPRAZOLE SODIUM 40 MG IV SOLR: 40.0000 mg | INTRAVENOUS | Status: DC

## 2021-07-10 MED ORDER — THIAMINE HCL 100 MG/ML IJ SOLN
100.0000 mg | Freq: Every day | INTRAMUSCULAR | Status: DC
  Filled 2021-07-10: qty 2

## 2021-07-10 MED ORDER — NICOTINE 14 MG/24HR TD PT24
14.0000 mg | MEDICATED_PATCH | Freq: Every day | TRANSDERMAL | Status: DC
Start: 2021-07-10 — End: 2021-07-13
  Filled 2021-07-10 (×3): qty 1

## 2021-07-10 MED ORDER — FOLIC ACID 1 MG PO TABS
1.0000 mg | ORAL_TABLET | Freq: Every day | ORAL | Status: DC
  Administered 2021-07-10 – 2021-07-13 (×4): 1 mg via ORAL
  Filled 2021-07-10 (×4): qty 1

## 2021-07-10 MED ORDER — ADULT MULTIVITAMIN W/MINERALS CH
1.0000 | ORAL_TABLET | Freq: Every day | ORAL | Status: DC
  Administered 2021-07-10 – 2021-07-13 (×4): 1 via ORAL
  Filled 2021-07-10 (×4): qty 1

## 2021-07-10 MED ORDER — HYDROMORPHONE HCL 1 MG/ML IJ SOLN
1.0000 mg | INTRAMUSCULAR | Status: DC | PRN
  Administered 2021-07-10 – 2021-07-11 (×2): 1 mg via INTRAVENOUS
  Filled 2021-07-10 (×2): qty 1

## 2021-07-10 MED ORDER — ONDANSETRON HCL 4 MG/2ML IJ SOLN: 4.0000 mg | Freq: Four times a day (QID) | INTRAMUSCULAR | Status: DC | PRN

## 2021-07-10 MED ORDER — THIAMINE HCL 100 MG PO TABS
100.0000 mg | ORAL_TABLET | Freq: Every day | ORAL | Status: DC
  Administered 2021-07-10 – 2021-07-13 (×4): 100 mg via ORAL
  Filled 2021-07-10 (×4): qty 1

## 2021-07-10 MED ORDER — CYCLOSPORINE 0.05 % OP EMUL
1.0000 [drp] | Freq: Two times a day (BID) | OPHTHALMIC | Status: DC
  Administered 2021-07-10 – 2021-07-12 (×5): 1 [drp] via OPHTHALMIC
  Filled 2021-07-10 (×5): qty 30

## 2021-07-10 MED ORDER — PANTOPRAZOLE SODIUM 40 MG IV SOLR
40.0000 mg | Freq: Two times a day (BID) | INTRAVENOUS | Status: DC
Start: 2021-07-10 — End: 2021-07-12
  Administered 2021-07-10 – 2021-07-11 (×3): 40 mg via INTRAVENOUS
  Filled 2021-07-10 (×4): qty 10

## 2021-07-10 MED ORDER — SODIUM CHLORIDE 0.9 % IV SOLN
Freq: Once | INTRAVENOUS | Status: AC
  Filled 2021-07-10: qty 1000

## 2021-07-10 MED ORDER — LORAZEPAM 2 MG/ML IJ SOLN: 1.0000 mg | INTRAMUSCULAR | Status: DC | PRN

## 2021-07-10 MED ORDER — ACETAMINOPHEN 650 MG RE SUPP: 650.0000 mg | Freq: Four times a day (QID) | RECTAL | Status: DC | PRN

## 2021-07-10 NOTE — Patient Instructions (Signed)
I would recommend you go to ER to further evaluate your abdominal pain, abdominal distention, yellow skin/eyes.  They will do work-up including blood work and imaging. ? ?I will call ER physician now and let them know you are coming. ? ?We also cover the hospital, and may be involved in your care as well. ? ?It was nice seeing you again today. ? ?Dr. Marletta Lor ?

## 2021-07-10 NOTE — ED Triage Notes (Signed)
Pt c/o abdominal pain x 4 days with yellow tint to skin; pt c/o bloating and tenderness to abdomen ?

## 2021-07-10 NOTE — Assessment & Plan Note (Addendum)
-  In the setting of alcohol abuse ?-Cessation counseling and complete abstinence has been encouraged.   ?-Instructed to maintain adequate hydration. ?-MRCP demonstrating structural changes suggesting cirrhosis/steatosis and splenomegaly. ?-Mild pancreatitis also appreciated without pseudocyst, necrosis or obstruction.  There is increased wall thickening in the gallbladder but no gallstones or dilatation of CBD. ?-Bilirubin has started to trend down; at discharge 8.3  ?-GI service has recommended treatment and tapering with prednisolone.  ?-Continue to follow electrolytes and LFTs with further repletion as needed.  ?-Outpatient follow-up with gastroenterology in 2 weeks. ?

## 2021-07-10 NOTE — H&P (Signed)
?History and Physical  ? ? ?Patient: Jake King ZOX:[id number] DOB: 09-14-1990 ?DOA: 07/10/2021 ?DOS: the patient was seen and examined on 07/10/2021 ?PCP: Lindell Spar, MD  ?Patient coming from: Home ? ?Chief Complaint:  ?Chief Complaint  ?Patient presents with  ? Abdominal Pain  ? ?HPI: Jake King is a 31 y.o. male with medical history significant of hypertension, tobacco abuse, alcohol abuse, gastroesophageal reflux disease, tobacco abuse, hepatic asteatosis and history of migraines; presented to the hospital secondary to abdominal pain and intractable nausea and vomiting.  Patient also has been noticed some yellow discoloration and reports general malaise.  He was seen by gastroenterology service as an outpatient with concerns for decompensation of alcoholic hepatitis and may be pancreatitis.  Referred to emergency department for further evaluation and management. ?CT abdomen demonstrating acute pancreatitis without pseudocyst or necrosis.  Lipase was elevated, patient with hyperbilirubinemia at 10.6 and findings suggestive of hepatic asteatosis and mild ascites. ?Patient denies chest pain, no hematemesis, no melena, no hematochezia, no dysuria, no hematuria, no focal weakness. ? ?GI service was consulted and [facility] contacted to place patient in the hospital for further evaluation and management. ? ?Review of Systems: As mentioned in the history of present illness. All other systems reviewed and are negative. ?Past Medical History:  ?Diagnosis Date  ? 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 Hepatitis   ? Hypertension   ? Migraine   ? Right knee injury   ? Seizures ([place])   ? Vitamin D deficiency   ? ?Past Surgical History:  ?Procedure Laterality Date  ? BIOPSY  06/10/2021  ? Procedure: BIOPSY;  Surgeon: Eloise Harman, DO;  [facility];  Service: Endoscopy;;  ? ESOPHAGOGASTRODUODENOSCOPY (EGD) WITH PROPOFOL N/A 06/10/2021  ? Procedure: ESOPHAGOGASTRODUODENOSCOPY (EGD) WITH PROPOFOL;   Surgeon: Eloise Harman, DO;  [facility];  Service: Endoscopy;  Laterality: N/A;  11:15am, ASA 2  ? NASAL SINUS SURGERY    ? NASAL SINUS SURGERY  2009  ? ?Social History:  reports that he has been smoking cigarettes. He has been smoking an average of .5 packs per day. He has quit using smokeless tobacco. He reports current alcohol use of about 2.0 - 3.0 standard drinks per week. He reports that he does not currently use drugs. ? ?Allergies  ?Allergen Reactions  ? Atropine Swelling  ?  Eye Drops from when he was a baby  ? Other Swelling  ?  SULFITES IN ALCOHOLS   ? ? ?Family History  ?Problem Relation Age of Onset  ? Other Mother   ?     "Twisted Colon"  ? Mental illness Mother   ? Diabetes Father   ? Hypertension Father   ? Pneumonia Father   ? Liver disease Father   ?     unsure what  ? Ulcerative colitis Paternal Grandmother   ? Colon polyps Paternal Grandmother   ? Congestive Heart Failure Paternal Grandfather   ? Hypertension Paternal Grandfather   ? Diabetes Paternal Grandfather   ? Multiple sclerosis Paternal Grandfather   ? Colon cancer Neg Hx   ? ? ?Prior to Admission medications   ?Medication Sig Start Date End Date Taking? Authorizing Provider  ?albuterol (VENTOLIN HFA) 108 (90 Base) MCG/ACT inhaler Inhale 2 puffs into the lungs every 4 (four) hours as needed for wheezing or shortness of breath. 05/01/21  Yes Lindell Spar, MD  ?Cholecalciferol (VITAMIN D) 50 MCG (2000 UT) CAPS Take 2,000 Units  by mouth in the morning.   Yes [provider]  ?cycloSPORINE (RESTASIS) 0.05 % ophthalmic emulsion Place 1 drop into both eyes 2 (two) times daily.   Yes [provider]  ?Multiple Vitamins-Minerals (MULTIVITAMIN WITH MINERALS) tablet Take 1 tablet by mouth in the morning.   Yes [provider]  ?pantoprazole (PROTONIX) 40 MG tablet Take 1 tablet (40 mg total) by mouth 2 (two) times daily. Take 30 minutes before breakfast 06/12/21 12/09/21 Yes Carver, Elon Alas, DO   ?prednisoLONE acetate (PRED FORTE) 1 % ophthalmic suspension 1 drop 4 (four) times daily.   Yes [provider]  ?verapamil (CALAN-SR) 120 MG CR tablet Take 1 tablet (120 mg total) by mouth at bedtime. 05/01/21  Yes Lindell Spar, MD  ? ? ?Physical Exam: ?Vitals:  ? 07/10/21 1500 07/10/21 1533 07/10/21 1546 07/10/21 1613  ?BP: 109/67 136/77  102/83  ?Pulse: 80 87  (!) 101  ?Resp:  13  15  ?Temp:   98.5 ?F (36.9 ?C) 98.8 ?F (37.1 ?C)  ?TempSrc:   Oral Oral  ?SpO2: 93% 97%  100%  ?Weight:      ?Height:      ? ?General exam: Alert, awake, oriented x 3; jaundice appreciated on examination; no chest pain, no fever, no shortness of breath. ?Respiratory system: Clear to auscultation. Respiratory effort normal.  Good saturation on room air. ?Cardiovascular system:RRR. No murmurs, rubs, gallops.  No JVD. ?Gastrointestinal system: Abdomen is slightly distended, soft, no guarding; tender to palpation in midepigastric area.  Positive bowel sounds.  ?Central nervous system: Alert and oriented. No focal neurological deficits. ?Extremities: No cyanosis or clubbing. ?Skin: No petechiae. ?Psychiatry: Judgement and insight appear normal. Mood & affect appropriate.  ? ?Data Reviewed: ?Patient DF score 29.5; no indicative for prednisolone therapy at this point. ?-CBC with WBCs of 4.2, hemoglobin 10.1 and platelets 140 K ?-Comprehensive metabolic panel demonstrating a sodium 134, potassium 3.6, BUN less than 5, creatinine 0.52, AST 155, ALT 55, alk phos 149; total bilirubin 10.6. ?-Lipase 92 ?-PT 17.1 with an INR of 1.4. ? ?Assessment and Plan: ?* Alcoholic hepatitis ?- In the setting of alcohol abuse ?-Cessation counseling has been provided ?-Provide fluid resuscitation ?-Given jaundice and hyperbilirubinemia MRCP has been ordered ?-GI service consulted and will follow recommendations. ?-Repeat complete metabolic panel in AM. ?-Follow ultralights and replete as needed. ? ?Alcohol induced acute pancreatitis ?-Elevated lipase  and CT findings suggesting acute pancreatitis process ?-No gangrene or pseudocyst appreciated ?-Bowel rest, fluid resuscitation, analgesics and antiemetics will be provided. ?-Follow clinical response. ? ?GERD (gastroesophageal reflux disease) ?- Continue PPI ? ?Alcohol dependence with other alcohol-induced disorder ([place]) ?- Cessation counseling provided ?-No acute withdrawal appreciated at this time ?-CIWA protocol has been ordered ?-Thiamine and folic acid provided. ? ?Tobacco abuse ?-Cessation counseling provided ?-Nicotine patch has been ordered. ? ?Hypertension ?-Stable vital signs current ?-Holding antihypertensive overnight ?-Provide fluid resuscitation ?-Follow-up blood pressure and treat accordingly. ? ? ? ? ? Advance Care Planning:   Code Status: Full Code  ? ?Consults: GI service ? ?Family Communication: No family at bedside. ? ?Severity of Illness: ?The appropriate patient status for this patient is INPATIENT. Inpatient status is judged to be reasonable and necessary in order to provide the required intensity of service to ensure the patient's safety. The patient's presenting symptoms, physical exam findings, and initial radiographic and laboratory data in the context of their chronic comorbidities is felt to place them at high risk for further clinical deterioration. Furthermore, it  is not anticipated that the patient will be medically stable for discharge from the hospital within 2 midnights of admission.  ? ?* I certify that at the point of admission it is my clinical judgment that the patient will require inpatient hospital care spanning beyond 2 midnights from the point of admission due to high intensity of service, high risk for further deterioration and high frequency of surveillance required.* ? ?Author: ?Barton Dubois, MD ?07/10/2021 7:06 PM ? ?For on call review [email or web address].  ?

## 2021-07-10 NOTE — Assessment & Plan Note (Addendum)
-  Absolute cessation//abstinence counseling provided ?-No acute withdrawal appreciated during this hospitalization ?-Continue treatment with thiamine and folic acid  ?

## 2021-07-10 NOTE — Assessment & Plan Note (Addendum)
-  Elevated lipase and CT findings suggesting acute pancreatitis process at time of admission. ?-No gangrene or pseudocyst appreciated; corroborated with findings on MRI. ?-No further nausea or vomiting.   ?-Patient advised to follow heart healthy diet and to maintain adequate hydration. ?-Tolerating regular diet at discharge. ?-As needed analgesics and antiemetics provided at discharge. ? ?

## 2021-07-10 NOTE — Assessment & Plan Note (Addendum)
-  Cessation counseling provided ?-Nicotine patch has been prescribed at discharge. ?

## 2021-07-10 NOTE — Assessment & Plan Note (Addendum)
Continue PPI ?

## 2021-07-10 NOTE — Progress Notes (Addendum)
? ? ?Referring Provider: Anabel Halon, MD ?Primary Care Physician:  Anabel Halon, MD ?Primary GI:  Dr. Marletta Lor ? ?Chief Complaint  ?Patient presents with  ? Follow-up  ?  Pt's eyes and skin are yellow, urine is brown (started since Sunday) abd pain  ? ? ?HPI:   ?Jake King is a 31 y.o. male who presents to clinic today for follow-up visit.  History of alcoholic hepatitis, chronic elevated LFTs dating back to 2021.  His liver function tests have showed a AST predominance over ALT consistent with alcoholic hepatitis. ? ?Full serological work-up unremarkable including negative HIV, negative hepatitis C, negative hepatitis B, GGT elevated, negative ANA, AMA, ASMA.  Iron studies abnormal with recommended HFE testing, unsure if this has been completed.  IgA elevated consistent with alcoholic hepatitis as well. ? ?[place] Jan 2022: moderate steatosis.  ? ?Today he states he is feeling worse.  Notes abdominal distention and pain.  Also with lower extremity edema.  Notes worsening jaundice and scleral icterus as well.  Feels generalized weak/fatigued. ? ?Currently drinking 1 beer every other day which he has slowly weaned down to. ? ?Previously was drinking much heavier.  States he was drinking 24 ounce Mike's hard lemonade 8% up to 8-9 daily. ? ?Past Medical History:  ?Diagnosis Date  ? 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 Hypertension   ? Migraine   ? Right knee injury   ? Seizures ([facility])   ? Vitamin D deficiency   ? ? ?Past Surgical History:  ?Procedure Laterality Date  ? BIOPSY  06/10/2021  ? Procedure: BIOPSY;  Surgeon: Lanelle Bal, DO;  [facility];  Service: Endoscopy;;  ? ESOPHAGOGASTRODUODENOSCOPY (EGD) WITH PROPOFOL N/A 06/10/2021  ? Procedure: ESOPHAGOGASTRODUODENOSCOPY (EGD) WITH PROPOFOL;  Surgeon: Lanelle Bal, DO;  [facility];  Service: Endoscopy;  Laterality: N/A;  11:15am, ASA 2  ? NASAL SINUS SURGERY    ? NASAL SINUS SURGERY  2009  ? ? ?Current Outpatient Medications   ?Medication Sig Dispense Refill  ? albuterol (VENTOLIN HFA) 108 (90 Base) MCG/ACT inhaler Inhale 2 puffs into the lungs every 4 (four) hours as needed for wheezing or shortness of breath. 18 g 5  ? Cholecalciferol (VITAMIN D) 50 MCG (2000 UT) CAPS Take 2,000 Units by mouth in the morning.    ? cycloSPORINE (RESTASIS) 0.05 % ophthalmic emulsion Place 1 drop into both eyes 2 (two) times daily.    ? Multiple Vitamins-Minerals (MULTIVITAMIN WITH MINERALS) tablet Take 1 tablet by mouth in the morning.    ? pantoprazole (PROTONIX) 40 MG tablet Take 1 tablet (40 mg total) by mouth 2 (two) times daily. Take 30 minutes before breakfast 60 tablet 5  ? prednisoLONE acetate (PRED FORTE) 1 % ophthalmic suspension 1 drop 4 (four) times daily.    ? verapamil (CALAN-SR) 120 MG CR tablet Take 1 tablet (120 mg total) by mouth at bedtime. 90 tablet 1  ? ?No current facility-administered medications for this visit.  ? ? ?Allergies as of 07/10/2021 - Review Complete 07/10/2021  ?Allergen Reaction Noted  ? Atropine Swelling 03/04/2020  ? Other Swelling 07/02/2020  ? ? ?Family History  ?Problem Relation Age of Onset  ? Other Mother   ?     "Twisted Colon"  ? Mental illness Mother   ? Diabetes Father   ? Hypertension Father   ? Pneumonia Father   ? Liver disease Father   ?     unsure  what  ? Ulcerative colitis Paternal Grandmother   ? Colon polyps Paternal Grandmother   ? Congestive Heart Failure Paternal Grandfather   ? Hypertension Paternal Grandfather   ? Diabetes Paternal Grandfather   ? Multiple sclerosis Paternal Grandfather   ? Colon cancer Neg Hx   ? ? ?Social History  ? ?Socioeconomic History  ? Marital status: Significant Other  ?  Spouse name: Morrie Sheldonshley  ? Number of children: 1  ? Years of education: GED  ? Highest education level: Not on file  ?Occupational History  ? Occupation: [employer]  ?Tobacco Use  ? Smoking status: Every Day  ?  Packs/day: 0.50  ?  Types: Cigarettes  ? Smokeless tobacco: Former  ?Substance and  Sexual Activity  ? Alcohol use: Yes  ?  Alcohol/week: 2.0 - 3.0 standard drinks  ?  Types: 2 - 3 Cans of beer per week  ?  Comment: 3-4 beers every night   ? Drug use: Not Currently  ?  Comment: no drugs since 2018  ? Sexual activity: Not on file  ?Other Topics Concern  ? Not on file  ?Social History Narrative  ? Lives with Salvadore DomGF [name]([place]) and their kids  ? Caffeine use: none  ? Right handed   ? ?Social Determinants of Health  ? ?Financial Resource Strain: Not on file  ?Food Insecurity: Not on file  ?Transportation Needs: Not on file  ?Physical Activity: Not on file  ?Stress: Not on file  ?Social Connections: Not on file  ? ? ?Subjective: ?Review of Systems  ?Constitutional:  Positive for malaise/fatigue. Negative for chills and fever.  ?HENT:  Negative for congestion and hearing loss.   ?Eyes:  Negative for blurred vision and double vision.  ?Respiratory:  Negative for cough and shortness of breath.   ?Cardiovascular:  Negative for chest pain and palpitations.  ?Gastrointestinal:  Positive for abdominal pain. Negative for blood in stool, constipation, diarrhea, heartburn, melena and vomiting.  ?Genitourinary:  Negative for dysuria and urgency.  ?Musculoskeletal:  Negative for joint pain and myalgias.  ?Skin:  Negative for itching and rash.  ?     Jaundice  ?Neurological:  Negative for dizziness and headaches.  ?Psychiatric/Behavioral:  Negative for depression. The patient is not nervous/anxious.   ? ? ?Objective: ?BP 126/60   Pulse (!) 109   Temp 98 ?F (36.7 ?C)   Ht 5\' 10"  (1.778 m)   Wt 194 lb (88 kg)   BMI 27.84 kg/m?  ?Physical Exam ?Constitutional:   ?   Appearance: Normal appearance. He is ill-appearing.  ?HENT:  ?   Head: Normocephalic and atraumatic.  ?Eyes:  ?   General: Scleral icterus present.  ?   Extraocular Movements: Extraocular movements intact.  ?Cardiovascular:  ?   Rate and Rhythm: Regular rhythm. Tachycardia present.  ?Pulmonary:  ?   Effort: Pulmonary effort is normal.  ?   Breath sounds:  Normal breath sounds.  ?Abdominal:  ?   General: Bowel sounds are normal. There is distension.  ?   Palpations: Abdomen is soft.  ?   Tenderness: There is abdominal tenderness.  ?Musculoskeletal:     ?   General: Normal range of motion.  ?   Cervical back: Normal range of motion and neck supple.  ?   Right lower leg: Edema present.  ?   Left lower leg: Edema present.  ?Skin: ?   General: Skin is warm.  ?   Coloration: Skin is jaundiced.  ?Neurological:  ?  General: No focal deficit present.  ?   Mental Status: He is alert and oriented to person, place, and time.  ?Psychiatric:     ?   Mood and Affect: Mood normal.     ?   Behavior: Behavior normal.  ? ? ? ?Assessment: ?*Alcoholic hepatitis ?*Jaundice ?*Abdominal pain and distention ? ?Plan: ?Given patient's appearance today, I am concerned about worsening alcoholic hepatitis.   ? ?He has worsening jaundice, abdominal distention as well as lower extremity edema on exam. ? ?I recommended he go to the ER for further evaluation.  GI service will be happy to consult on patient if admitted. ? ?Patient is agreeable.  I have called and discussed case with ER physician Dr. Estell Harpin who is expecting patient. ? ?07/10/2021 10:03 AM ? ? ?Disclaimer: This note was dictated with voice recognition software. Similar sounding words can inadvertently be transcribed and may not be corrected upon review. ? ?

## 2021-07-10 NOTE — Progress Notes (Signed)
Patient to be seen in follow up in the morning. Seen today by Dr. Marletta Lor in the office and noted to be jaundiced. C/O refractory N/V. 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 Reviewed labs and CT this admission. Findings most c/w etoh hepatitis. His liver and spleen are both enlarged. Previously work up negative for viral hepatitis, autoimmune hepatitis. His iron sats and ferritin have been elevated in the setting of etoh. His Hgb is down from previously.  ? ?His DF is 29.5. No indication at this point for prednisolone. Will reevaluate in the morning. Will add on HFE markers as requested by [place] previously. Check B12/folate.  ? ?Leanna Battles. Melvyn Neth, PA-C ?[facility] ?[phone] ?4/27/20235:39 PM ? ? ?

## 2021-07-10 NOTE — ED Provider Notes (Signed)
?[facility]PARTMENT ?Provider Note ? ? ?CSN: [id number] ?Arrival date & time: 07/10/21  1049 ? ?  ? ?History ? ?Chief Complaint  ?Patient presents with  ? Abdominal Pain  ? ? ?Jake King is a 31 y.o. male. ? ? ?Abdominal Pain ? ?Patient with medical history notable for hypertension, alcohol dependence, elevated LFTs, alcoholic hepatitis presents today due to jaundice.  Patient states 4 days ago on Sunday started noticing he looked yellow.  He also had bright upper quadrant abdominal pain which has been constant.  States she feels generalized fatigue which has been worsening over the past 4 days.  Associated with nausea and vomiting, unable to tolerate anything by mouth.  Was seen earlier today by Dr. Marletta Lor his gastroenterologist who advised going to the ED for likely admission for alcoholic hepatitis.  Patient denies any fevers, no mental status changes.  No dark and tarry stool, no blood in the stool or hematemesis.  No seizures. ? ?Social history: Patient has daily tobacco dependence.  Working on reducing his alcohol consumption, currently only having 1 beer or every other day. ? ?Home Medications ?Prior to Admission medications   ?Medication Sig Start Date End Date Taking? Authorizing Provider  ?albuterol (VENTOLIN HFA) 108 (90 Base) MCG/ACT inhaler Inhale 2 puffs into the lungs every 4 (four) hours as needed for wheezing or shortness of breath. 05/01/21  Yes Anabel Halon, MD  ?Cholecalciferol (VITAMIN D) 50 MCG (2000 UT) CAPS Take 2,000 Units by mouth in the morning.   Yes [provider]  ?cycloSPORINE (RESTASIS) 0.05 % ophthalmic emulsion Place 1 drop into both eyes 2 (two) times daily.   Yes [provider]  ?Multiple Vitamins-Minerals (MULTIVITAMIN WITH MINERALS) tablet Take 1 tablet by mouth in the morning.   Yes [provider]  ?pantoprazole (PROTONIX) 40 MG tablet Take 1 tablet (40 mg total) by mouth 2 (two) times daily. Take 30 minutes before  breakfast 06/12/21 12/09/21 Yes Carver, Hennie Duos, DO  ?prednisoLONE acetate (PRED FORTE) 1 % ophthalmic suspension 1 drop 4 (four) times daily.   Yes [provider]  ?verapamil (CALAN-SR) 120 MG CR tablet Take 1 tablet (120 mg total) by mouth at bedtime. 05/01/21  Yes Anabel Halon, MD  ?   ? ?Allergies    ?Atropine and Other   ? ?Review of Systems   ?Review of Systems  ?Gastrointestinal:  Positive for abdominal pain.  ? ?Physical Exam ?Updated Vital Signs ?BP 107/73   Pulse 81   Temp 98.6 ?F (37 ?C) (Oral)   Resp 18   Ht 5\' 10"  (1.778 m)   Wt 88 kg   SpO2 94%   BMI 27.84 kg/m?  ?Physical Exam ?Vitals and nursing note reviewed. Exam conducted with a chaperone present.  ?Constitutional:   ?   Appearance: Normal appearance.  ?HENT:  ?   Head: Normocephalic and atraumatic.  ?Eyes:  ?   General: Scleral icterus present.     ?   Right eye: No discharge.     ?   Left eye: No discharge.  ?   Extraocular Movements: Extraocular movements intact.  ?   Pupils: Pupils are equal, round, and reactive to light.  ?Cardiovascular:  ?   Rate and Rhythm: Normal rate and regular rhythm.  ?   Pulses: Normal pulses.  ?   Heart sounds: Normal heart sounds. No murmur heard. ?  No friction rub. No gallop.  ?Pulmonary:  ?   Effort: Pulmonary effort is  normal. No respiratory distress.  ?   Breath sounds: Normal breath sounds.  ?Abdominal:  ?   General: Abdomen is flat. Bowel sounds are normal. There is distension.  ?   Palpations: Abdomen is soft.  ?   Tenderness: There is abdominal tenderness.  ?   Comments: Abdominal distention, right upper quadrant tenderness to palpation  ?Musculoskeletal:  ?   Right lower leg: Edema present.  ?   Left lower leg: Edema present.  ?Skin: ?   General: Skin is warm and dry.  ?   Coloration: Skin is jaundiced.  ?Neurological:  ?   Mental Status: He is alert and oriented to person, place, and time. Mental status is at baseline.  ?   Coordination: Coordination normal.  ? ? ?ED Results /  Procedures / Treatments   ?Labs ?(all labs ordered are listed, but only abnormal results are displayed) ?Labs Reviewed  ?CBC WITH DIFFERENTIAL/PLATELET - Abnormal; Notable for the following components:  ?    Result Value  ? RBC 2.63 (*)   ? Hemoglobin 10.1 (*)   ? HCT 27.6 (*)   ? MCV 104.9 (*)   ? MCH 38.4 (*)   ? MCHC 36.6 (*)   ? RDW 18.9 (*)   ? Platelets 140 (*)   ? All other components within normal limits  ?COMPREHENSIVE METABOLIC PANEL - Abnormal; Notable for the following components:  ? Sodium 134 (*)   ? Glucose, Bld 106 (*)   ? BUN <5 (*)   ? Creatinine, Ser 0.52 (*)   ? Calcium 8.1 (*)   ? Total Protein 5.9 (*)   ? Albumin 2.2 (*)   ? AST 155 (*)   ? ALT 55 (*)   ? Alkaline Phosphatase 149 (*)   ? Total Bilirubin 10.6 (*)   ? All other components within normal limits  ?LIPASE, BLOOD - Abnormal; Notable for the following components:  ? Lipase 92 (*)   ? All other components within normal limits  ?AMMONIA - Abnormal; Notable for the following components:  ? Ammonia 37 (*)   ? All other components within normal limits  ?PROTIME-INR - Abnormal; Notable for the following components:  ? Prothrombin Time 17.1 (*)   ? INR 1.4 (*)   ? All other components within normal limits  ?APTT - Abnormal; Notable for the following components:  ? aPTT 38 (*)   ? All other components within normal limits  ?URINALYSIS, ROUTINE W REFLEX MICROSCOPIC - Abnormal; Notable for the following components:  ? Color, Urine AMBER (*)   ? Bilirubin Urine MODERATE (*)   ? All other components within normal limits  ? ? ?EKG ?None ? ?Radiology ?CT Abdomen Pelvis W Contrast ? ?Result Date: 07/10/2021 ?CLINICAL DATA:  Abdominal pain EXAM: CT ABDOMEN AND PELVIS WITH CONTRAST TECHNIQUE: Multidetector CT imaging of the abdomen and pelvis was performed using the standard protocol following bolus administration of intravenous contrast. RADIATION DOSE REDUCTION: This exam was performed according to the departmental dose-optimization program which  includes automated exposure control, adjustment of the mA and/or kV according to patient size and/or use of iterative reconstruction technique. CONTRAST:  OMNIPAQUE IOHEXOL 300 MG/ML  SOLN COMPARISON:  None. FINDINGS: Lower chest: No acute abnormality. Hepatobiliary: Liver is enlarged measuring 24.3 cm in length. Diffuse inhomogeneous hypodense appearance of the hepatic parenchyma most consistent with steatosis. Mild gallbladder wall thickening with no definite cholelithiasis visualized. Small amount of pericholecystic ascites. No biliary ductal dilatation. Pancreas: No pancreatic mass or  ductal dilatation identified. Mild peripancreatic fat stranding. Spleen: Enlarged measuring 14.8 cm in length. Adrenals/Urinary Tract: Adrenal glands appear normal. Kidneys are normal. Urinary bladder appears within normal limits. Stomach/Bowel: No bowel obstruction, free air or pneumatosis. Mild right colonic bowel wall thickening/edema. No evidence of acute appendicitis. Vascular/Lymphatic: Mildly enlarged peripancreatic and portacaval lymph nodes measuring up to 1.1 cm in short axis series 2, image 35. No aneurysm identified. Reproductive: Prostate is unremarkable. Other: Small volume ascites. Musculoskeletal: No suspicious bony lesions identified. IMPRESSION: 1. Mild peripancreatic fat stranding which may represent acute pancreatitis, correlate clinically. 2. Marked hepatomegaly with inhomogeneous hypodense parenchyma suggesting hepatic steatosis. 3. Small volume ascites. 4. Mild gallbladder wall thickening which is likely related to surrounding ascites and liver disease. 5. Splenomegaly. Electronically Signed   By: Jannifer Hickelaney  Williams M.D.   On: 07/10/2021 14:09   ? ?Procedures ?Procedures  ? ? ?Medications Ordered in ED ?Medications  ?fentaNYL (SUBLIMAZE) injection 50 mcg (has no administration in time range)  ?ondansetron (ZOFRAN) injection 4 mg (has no administration in time range)  ?sodium chloride 0.9 % 1,000 mL with  M.V.I. Adult (INFUVITE ADULT) 10 mL infusion (has no administration in time range)  ?iohexol (OMNIPAQUE) 300 MG/ML solution 100 mL (100 mLs Intravenous Contrast Given 07/10/21 1342)  ? ? ?ED Course/ Medical Decision Making/ A&P

## 2021-07-10 NOTE — Assessment & Plan Note (Addendum)
-  Stable overall. ?-Continue to follow heart healthy diet ?-At discharge resume home antihypertensive regimen. ?

## 2021-07-11 ENCOUNTER — Other Ambulatory Visit (HOSPITAL_COMMUNITY): Payer: Self-pay

## 2021-07-11 ENCOUNTER — Inpatient Hospital Stay (HOSPITAL_COMMUNITY): Payer: Managed Care, Other (non HMO)

## 2021-07-11 DIAGNOSIS — K7011 Alcoholic hepatitis with ascites: Secondary | ICD-10-CM | POA: Diagnosis not present

## 2021-07-11 DIAGNOSIS — R17 Unspecified jaundice: Secondary | ICD-10-CM | POA: Diagnosis not present

## 2021-07-11 DIAGNOSIS — K7010 Alcoholic hepatitis without ascites: Secondary | ICD-10-CM | POA: Diagnosis not present

## 2021-07-11 DIAGNOSIS — D696 Thrombocytopenia, unspecified: Secondary | ICD-10-CM | POA: Diagnosis present

## 2021-07-11 DIAGNOSIS — K852 Alcohol induced acute pancreatitis without necrosis or infection: Secondary | ICD-10-CM | POA: Diagnosis not present

## 2021-07-11 DIAGNOSIS — K219 Gastro-esophageal reflux disease without esophagitis: Secondary | ICD-10-CM | POA: Diagnosis not present

## 2021-07-11 LAB — CBC WITH DIFFERENTIAL/PLATELET
Abs Immature Granulocytes: 0.02 10*3/uL (ref 0.00–0.07)
Basophils Absolute: 0 10*3/uL (ref 0.0–0.1)
Basophils Relative: 0 %
Eosinophils Absolute: 0.1 10*3/uL (ref 0.0–0.5)
Eosinophils Relative: 3 %
HCT: 26.3 % — ABNORMAL LOW (ref 39.0–52.0)
Hemoglobin: 9.6 g/dL — ABNORMAL LOW (ref 13.0–17.0)
Immature Granulocytes: 1 %
Lymphocytes Relative: 29 %
Lymphs Abs: 1.3 10*3/uL (ref 0.7–4.0)
MCH: 38.2 pg — ABNORMAL HIGH (ref 26.0–34.0)
MCHC: 36.5 g/dL — ABNORMAL HIGH (ref 30.0–36.0)
MCV: 104.8 fL — ABNORMAL HIGH (ref 80.0–100.0)
Monocytes Absolute: 0.5 10*3/uL (ref 0.1–1.0)
Monocytes Relative: 11 %
Neutro Abs: 2.5 10*3/uL (ref 1.7–7.7)
Neutrophils Relative %: 56 %
Platelets: 129 10*3/uL — ABNORMAL LOW (ref 150–400)
RBC: 2.51 MIL/uL — ABNORMAL LOW (ref 4.22–5.81)
RDW: 19.4 % — ABNORMAL HIGH (ref 11.5–15.5)
WBC: 4.4 10*3/uL (ref 4.0–10.5)
nRBC: 0 % (ref 0.0–0.2)

## 2021-07-11 LAB — BILIRUBIN, FRACTIONATED(TOT/DIR/INDIR)
Bilirubin, Direct: 6.7 mg/dL — ABNORMAL HIGH (ref 0.0–0.2)
Indirect Bilirubin: 4.6 mg/dL — ABNORMAL HIGH (ref 0.3–0.9)
Total Bilirubin: 11.3 mg/dL — ABNORMAL HIGH (ref 0.3–1.2)

## 2021-07-11 LAB — COMPREHENSIVE METABOLIC PANEL
ALT: 49 U/L — ABNORMAL HIGH (ref 0–44)
AST: 139 U/L — ABNORMAL HIGH (ref 15–41)
Albumin: 2.1 g/dL — ABNORMAL LOW (ref 3.5–5.0)
Alkaline Phosphatase: 133 U/L — ABNORMAL HIGH (ref 38–126)
Anion gap: 7 (ref 5–15)
BUN: 6 mg/dL (ref 6–20)
CO2: 26 mmol/L (ref 22–32)
Calcium: 8 mg/dL — ABNORMAL LOW (ref 8.9–10.3)
Chloride: 104 mmol/L (ref 98–111)
Creatinine, Ser: 0.54 mg/dL — ABNORMAL LOW (ref 0.61–1.24)
GFR, Estimated: >60 mL/min (ref >60)
Glucose, Bld: 69 mg/dL — ABNORMAL LOW (ref 70–99)
Potassium: 4.1 mmol/L (ref 3.5–5.1)
Sodium: 137 mmol/L (ref 135–145)
Total Bilirubin: 11 mg/dL — ABNORMAL HIGH (ref 0.3–1.2)
Total Protein: 5.6 g/dL — ABNORMAL LOW (ref 6.5–8.1)

## 2021-07-11 LAB — PROTIME-INR
INR: 1.4 — ABNORMAL HIGH (ref 0.8–1.2)
Prothrombin Time: 17.5 seconds — ABNORMAL HIGH (ref 11.4–15.2)

## 2021-07-11 LAB — MAGNESIUM: Magnesium: 1.4 mg/dL — ABNORMAL LOW (ref 1.7–2.4)

## 2021-07-11 LAB — PHOSPHORUS: Phosphorus: 3.7 mg/dL (ref 2.5–4.6)

## 2021-07-11 MED ORDER — PREDNISOLONE 5 MG PO TABS
40.0000 mg | ORAL_TABLET | Freq: Every day | ORAL | Status: DC
  Administered 2021-07-11 – 2021-07-13 (×3): 40 mg via ORAL
  Filled 2021-07-11 (×4): qty 8

## 2021-07-11 MED ORDER — MAGNESIUM SULFATE 2 GM/50ML IV SOLN
2.0000 g | Freq: Once | INTRAVENOUS | Status: AC
  Administered 2021-07-11: 2 g via INTRAVENOUS
  Filled 2021-07-11: qty 50

## 2021-07-11 MED ORDER — HYDROMORPHONE HCL 1 MG/ML IJ SOLN
1.0000 mg | Freq: Four times a day (QID) | INTRAMUSCULAR | Status: DC | PRN
  Administered 2021-07-11 – 2021-07-13 (×6): 1 mg via INTRAVENOUS
  Filled 2021-07-11 (×7): qty 1

## 2021-07-11 MED ORDER — GADOBUTROL 1 MMOL/ML IV SOLN
9.0000 mL | Freq: Once | INTRAVENOUS | Status: AC | PRN
  Administered 2021-07-11: 9 mL via INTRAVENOUS

## 2021-07-11 MED ORDER — PREDNISOLONE ACETATE 1 % OP SUSP
1.0000 [drp] | Freq: Four times a day (QID) | OPHTHALMIC | Status: DC
  Administered 2021-07-11 – 2021-07-13 (×9): 1 [drp] via OPHTHALMIC
  Filled 2021-07-11: qty 1

## 2021-07-11 NOTE — Progress Notes (Signed)
Consult for SA resources. Patient declines SA resources and indicated that he stopped on his own "before this."  ?TOC signing off. ? ? ?Victoriana Aziz D, LCSW  ?

## 2021-07-11 NOTE — Progress Notes (Signed)
?Progress Note ? ? ?Patient: Jake King DOB: 14-Jun-1990 DOA: 07/10/2021     1 ?DOS: the patient was seen and examined on 07/11/2021 ?  ?Brief hospital course: ?Jake King is a 31 y.o. male with medical history significant of hypertension, tobacco abuse, alcohol abuse, gastroesophageal reflux disease, tobacco abuse, hepatic asteatosis and history of migraines; presented to the hospital secondary to abdominal pain and intractable nausea and vomiting.  Patient also has been noticed some yellow discoloration and reports general malaise.  He was seen by gastroenterology service as an outpatient with concerns for decompensation of alcoholic hepatitis and may be pancreatitis.  Referred to emergency department for further evaluation and management. ?CT abdomen demonstrating acute pancreatitis without pseudocyst or necrosis.  Lipase was elevated, patient with hyperbilirubinemia at 10.6 and findings suggestive of hepatic asteatosis and mild ascites. ?Patient denies chest pain, no hematemesis, no melena, no hematochezia, no dysuria, no hematuria, no focal weakness. ?  ?GI service was consulted and [facility] contacted to place patient in the hospital for further evaluation and management. ?  ? ?Assessment and Plan: ?* Alcoholic hepatitis ?-In the setting of alcohol abuse ?-Cessation counseling has been provided ?-Continue to maintain adequate hydration ?-MRCP demonstrating structural changes suggesting cirrhosis/steatosis and splenomegaly. ?-Mild pancreatitis also appreciated without pseudocyst, necrosis or obstruction.  There is increased wall thickening in the gallbladder but no gallstones or dilatation of CBD. ?-Bilirubin up to 11 currently; fractionated 6.7 ?-GI service on board and will follow recommendations ?-Continue following electrolytes and replete as needed. ? ?Thrombocytopenia ([place]) ?- In the setting of alcohol abuse most likely ?-Avoid the use of heparin products and NSAIDs ?-No overt  bleeding appreciated currently ?-Continue to follow platelets count trend. ? ?Hypomagnesemia ?- In the setting of alcohol abuse most likely ?-Follow electrolytes and replete as needed. ? ?Alcohol induced acute pancreatitis ?-Elevated lipase and CT findings suggesting acute pancreatitis process at time of admission. ?-No gangrene or pseudocyst appreciated; corroborated with findings on MRI. ?-No further nausea or vomiting.  Slowly start advancing diet.  -Continue supportive care, IV fluids, antiemetics and analgesics. ? ? ?GERD (gastroesophageal reflux disease) ?-Continue PPI ? ?Alcohol dependence with other alcohol-induced disorder ([place]) ?-Cessation counseling provided ?-No acute withdrawal appreciated at this time ?-CIWA protocol has been ordered ?-Continue treatment with thiamine and folic acid  ? ?Tobacco abuse ?-Cessation counseling provided ?-Nicotine patch has been ordered. ? ?Hypertension ?-Stable overall. ?-Continue holding antihypertensive overnight ?-Provide fluid resuscitation ?-Follow-up blood pressure and treat accordingly. ? ? ? ?Subjective:  ?Complaining of abdominal discomfort (midepigastric and right upper quadrant); no nausea or vomiting.  Expressed feeling hungry.  Positive jaundice appreciated on examination.  Bilirubin level trending up. ? ?Physical Exam: ?Vitals:  ? 07/10/21 1613 07/10/21 2216 07/11/21 0206 07/11/21 0440  ?BP: 102/83 112/76 108/70 111/71  ?Pulse: (!) 101 89 88 93  ?Resp: _0 ?Temp: 98.8 ?F (37.1 ?C) 98.1 ?F (36.7 ?C) 98.1 ?F (36.7 ?C) 98.5 ?F (36.9 ?C)  ?TempSrc: Oral Oral  Oral  ?SpO2: 100% 94% 96% 96%  ?Weight:      ?Height:      ? ?General exam: Alert, awake, oriented x 3; positive jaundice appreciated.  Reports no nausea or vomiting; still complaining of right upper quadrant/midepigastric area discomfort (even expressed to be more improved. ?Respiratory system: Clear to auscultation. Respiratory effort normal.  No using accessory muscles.  Good saturation on room  air. ?Cardiovascular system:RRR. No murmurs, rubs, gallops.  No JVD. ?Gastrointestinal system: Abdomen is slightly distended,  soft, no guarding; positive mid epigastric and right upper quadrant discomfort on the palpation.  Positive bowel sounds appreciated.  ?Central nervous system: Alert and oriented. No focal neurological deficits. ?Extremities: No cyanosis or clubbing. ?Skin: No petechiae. ?Psychiatry: Judgement and insight appear normal. Mood & affect appropriate.  ? ?Data Reviewed: ?Magnesium 1.4 ?Bilirubin 11.3; direct bilirubin 6.7 and indirect bilirubin 4.6. ?Phosphorus 3.7 ?INR 1.4; PT 17.5 ?CBC demonstrating a WBCs of 4.4, hemoglobin 9.6 and platelet count 129 K ?Comprehensive metabolic panel demonstrating a sodium of 137, potassium 4.1, BUN 6, creatinine 0.54, AST 139, ALT 49, alk phos 133 ?Albumin 2.1 ? ?Family Communication: No family at bedside. ? ?Disposition: ?Status is: Inpatient ?Remains inpatient appropriate because: Still with elevated bilirubin, complaining of abdominal pain and having jaundice.  No nausea vomiting; starting to advance diet. ? ? Planned Discharge Destination: Home ? ? ?Author: ?Barton Dubois, MD ?07/11/2021 11:57 AM ? ?For on call review [email or web address].  ?

## 2021-07-11 NOTE — Assessment & Plan Note (Addendum)
-  In the setting of alcohol abuse most likely ?-Repleted. ?

## 2021-07-11 NOTE — Assessment & Plan Note (Addendum)
-  In the setting of alcohol abuse most likely ?-Avoid the use of heparin products and NSAIDs ?-No overt bleeding appreciated  ?-Would recommend repeat CBC to follow hemoglobin and platelet count stability. ?

## 2021-07-11 NOTE — Progress Notes (Signed)
? ?Gastroenterology Progress Note  ? ?Referring Provider: No ref. provider found ?Primary Care Physician:  Lindell Spar, MD ?Primary Gastroenterologist:  Dr. Abbey Chatters ? ?Patient ID: Jake King; [id number]; 09-Mar-1991  ? ? ?Subjective  ? ?He continues to complain of upper abdominal pain.  Denies any nausea or vomiting.  No diarrhea or constipation.  No melena, hematochezia, hematemesis.  Observed drinking water and eating Jell-O, has denied any pain with liquids.  Denies any cough. Denies confusion, tremors, or headaches. Does admit to drinking to help him sleep.  ? ? ?Objective  ? ?Vital signs in last 24 hours ?Temp:  [98.1 ?F (36.7 ?C)-98.8 ?F (37.1 ?C)] 98.5 ?F (36.9 ?C) (04/28 0440) ?Pulse Rate:  [80-101] 93 (04/28 0440) ?Resp:  [13-18] 18 (04/28 0440) ?BP: (102-136)/(67-83) 111/71 (04/28 0440) ?SpO2:  [93 %-100 %] 96 % (04/28 0440) ?Last BM Date : 07/09/21 ? ?Physical Exam ?General:   Alert and oriented, pleasant ?Head:  Normocephalic and atraumatic. ?Eyes: Sclera icterus.  Conjuctiva pink.  ?Heart:  S1, S2 present, no murmurs noted.  ?Lungs: Clear to auscultation bilaterally, without wheezing, rales, or rhonchi.  ?Abdomen:  Bowel sounds present, soft, tenderness to RUQ, epigastrium, LUQ, nondistended.  Hepatomegaly present.  No rebound or guarding. No masses appreciated  ?Msk:  Symmetrical without gross deformities. Normal posture. ?Pulses:  Normal pulses noted. ?Extremities:  Without clubbing or edema. ?Neurologic:  Alert and  oriented x4;  grossly normal neurologically.  No asterixis.  No tremors. ?Skin: Jaundice.  Warm and dry, intact without significant lesions.  ?Psych:  Alert and cooperative. Normal mood and affect. ? ?Intake/Output from previous day: ?04/27 0701 - 04/28 0700 ?In: 1081.2 [I.V.:1081.2] ?Out: -  ?Intake/Output this shift: ?Total I/O ?In: 480 [P.O.:480] ?Out: -  ? ?Lab Results ? ?Recent Labs  ?  07/10/21 ?1055 07/11/21 ?0444  ?WBC 4.2 4.4  ?HGB 10.1* 9.6*  ?HCT 27.6* 26.3*  ?PLT  140* 129*  ? ?BMET ?Recent Labs  ?  07/10/21 ?1055 07/11/21 ?0444  ?NA 134* 137  ?K 3.6 4.1  ?CL 100 104  ?CO2 26 26  ?GLUCOSE 106* 69*  ?BUN <5* 6  ?CREATININE 0.52* 0.54*  ?CALCIUM 8.1* 8.0*  ? ?LFT ?Recent Labs  ?  07/10/21 ?1055 07/11/21 ?0444  ?PROT 5.9* 5.6*  ?ALBUMIN 2.2* 2.1*  ?AST 155* 139*  ?ALT 55* 49*  ?ALKPHOS 149* 133*  ?BILITOT 10.6* 11.3*  11.0*  ?BILIDIR  --  6.7*  ?IBILI  --  4.6*  ? ?PT/INR ?Recent Labs  ?  07/10/21 ?1055 07/11/21 ?0444  ?LABPROT 17.1* 17.5*  ?INR 1.4* 1.4*  ? ?Hepatitis Panel ?No results for input(s): HEPBSAG, HCVAB, HEPAIGM, HEPBIGM in the last 72 hours. ? ? ?Studies/Results ?CT Abdomen Pelvis W Contrast ? ?Result Date: 07/10/2021 ?CLINICAL DATA:  Abdominal pain EXAM: CT ABDOMEN AND PELVIS WITH CONTRAST TECHNIQUE: Multidetector CT imaging of the abdomen and pelvis was performed using the standard protocol following bolus administration of intravenous contrast. RADIATION DOSE REDUCTION: This exam was performed according to the departmental dose-optimization program which includes automated exposure control, adjustment of the mA and/or kV according to patient size and/or use of iterative reconstruction technique. CONTRAST:  125mL OMNIPAQUE IOHEXOL 300 MG/ML  SOLN COMPARISON:  None. FINDINGS: Lower chest: No acute abnormality. Hepatobiliary: Liver is enlarged measuring 24.3 cm in length. Diffuse inhomogeneous hypodense appearance of the hepatic parenchyma most consistent with steatosis. Mild gallbladder wall thickening with no definite cholelithiasis visualized. Small amount of pericholecystic ascites. No biliary ductal dilatation. Pancreas: No  pancreatic mass or ductal dilatation identified. Mild peripancreatic fat stranding. Spleen: Enlarged measuring 14.8 cm in length. Adrenals/Urinary Tract: Adrenal glands appear normal. Kidneys are normal. Urinary bladder appears within normal limits. Stomach/Bowel: No bowel obstruction, free air or pneumatosis. Mild right colonic bowel wall  thickening/edema. No evidence of acute appendicitis. Vascular/Lymphatic: Mildly enlarged peripancreatic and portacaval lymph nodes measuring up to 1.1 cm in short axis series 2, image 35. No aneurysm identified. Reproductive: Prostate is unremarkable. Other: Small volume ascites. Musculoskeletal: No suspicious bony lesions identified. IMPRESSION: 1. Mild peripancreatic fat stranding which may represent acute pancreatitis, correlate clinically. 2. Marked hepatomegaly with inhomogeneous hypodense parenchyma suggesting hepatic steatosis. 3. Small volume ascites. 4. Mild gallbladder wall thickening which is likely related to surrounding ascites and liver disease. 5. Splenomegaly. Electronically Signed   By: Ofilia Neas M.D.   On: 07/10/2021 14:09  ? ?MR 3D Recon At Scanner ? ?Result Date: 07/11/2021 ?CLINICAL DATA:  Jaundice, follow-up CT mild pancreatic fat stranding may reflect acute pancreatitis. EXAM: MRI ABDOMEN WITHOUT AND WITH CONTRAST (INCLUDING MRCP) TECHNIQUE: Multiplanar multisequence MR imaging of the abdomen was performed both before and after the administration of intravenous contrast. heavily T2-weighted images of the biliary and pancreatic ducts were obtained, and three-dimensional MRCP images were rendered by post processing. CONTRAST:  28mL GADAVIST GADOBUTROL 1 MMOL/ML IV SOLN COMPARISON:  COMPARISON Abdominal ultrasound May 28, 2021 and CT abdomen pelvis July 10, 2021 FINDINGS: Lower chest: No acute abnormality. Hepatobiliary: Marked hepatomegaly measuring 27 cm in maximum craniocaudal dimension at the midclavicular line. Out of phase imaging demonstrates signal loss throughout the hepatic parenchyma with some geographic areas of signal loss worse than others, consistent with hepatic steatosis. Subtle hepatic contour nodularity. No suspicious hepatic lesion. Mild periportal edema. Cholelithiasis in a distended gallbladder with pericholecystic fluid and gallbladder wall thickening. No biliary  ductal dilation. No choledocholithiasis. No peribiliary enhancement. Pancreas: Peripancreatic stranding but with normal intrinsic T1 signal of the pancreatic parenchyma. No pancreatic ductal dilation. homogeneous enhancement of the pancreas postcontrast administration. Spleen: Borderline splenomegaly measuring 13.8 cm in maximum axial dimension. Adrenals/Urinary Tract: Bilateral adrenal glands are within normal limits. No hydronephrosis. No solid enhancing renal mass. Stomach/Bowel: Stomach is nondistended limiting evaluation. No pathologic dilation of large or small bowel in the abdomen. Subtle wall thickening of the ascending colon/hepatic flexure. Vascular/Lymphatic: Normal caliber abdominal aorta. The portal, splenic and superior mesenteric veins are patent. Enlarged periportal lymph nodes for instance an aortocaval lymph node measuring 10 mm on image 25/8 are favored reactive. Other: Trace abdominopelvic free fluid. Musculoskeletal: No suspicious osseous lesion. IMPRESSION: 1. Hepatomegaly with severe geographic hepatic steatosis. Subtle hepatic contour nodularity is suggestive of early cirrhosis. 2. Borderline splenomegaly with trace abdominal ascites which is nonspecific but in the setting of cirrhosis would suggest portal hypertension. 3. Peripancreatic stranding but with normal intrinsic T1 signal of the pancreas and normal enhancement of the pancreas postcontrast administration. Findings are nonspecific and favored related to the hepatic pathology but a very mild interstitial pancreatitis is not excluded. 4. Cholelithiasis with pericholecystic fluid and gallbladder wall thickening, likely reflecting sequela of hepatocellular disease. If concern for acute cholecystitis suggest further evaluation with nuclear medicine HIDA scan. 5. Subtle wall thickening of the ascending colon/hepatic flexure likely reactive or related to portal colopathy. Electronically Signed   By: Dahlia Bailiff M.D.   On: 07/11/2021  10:54  ? ?MR ABDOMEN MRCP W WO CONTAST ? ?Result Date: 07/11/2021 ?CLINICAL DATA:  Jaundice, follow-up CT mild pancreatic fat stranding may reflect acute  pancreatitis. EXAM: MRI ABDOMEN WITHOUT AND WITH CONTR

## 2021-07-12 DIAGNOSIS — K7010 Alcoholic hepatitis without ascites: Secondary | ICD-10-CM | POA: Diagnosis not present

## 2021-07-12 DIAGNOSIS — K852 Alcohol induced acute pancreatitis without necrosis or infection: Secondary | ICD-10-CM | POA: Diagnosis not present

## 2021-07-12 DIAGNOSIS — K7011 Alcoholic hepatitis with ascites: Secondary | ICD-10-CM | POA: Diagnosis not present

## 2021-07-12 DIAGNOSIS — R17 Unspecified jaundice: Secondary | ICD-10-CM | POA: Diagnosis not present

## 2021-07-12 MED ORDER — PANTOPRAZOLE SODIUM 40 MG PO TBEC
40.0000 mg | DELAYED_RELEASE_TABLET | Freq: Two times a day (BID) | ORAL | Status: DC
  Administered 2021-07-12 – 2021-07-13 (×3): 40 mg via ORAL
  Filled 2021-07-12 (×3): qty 1

## 2021-07-12 NOTE — Progress Notes (Signed)
Patient feels much better today.  Less right upper quadrant abdominal pain ?Tolerating diet. ?Prednisolone started yesterday. ? ? ?Vital signs in last 24 hours: ?Temp:  [98 ?F (36.7 ?C)-98.5 ?F (36.9 ?C)] 98 ?F (36.7 ?C) (04/29 0546) ?Pulse Rate:  [72-76] 72 (04/29 0546) ?Resp:  [19-20] 19 (04/29 0546) ?BP: (105-112)/(70-80) 112/80 (04/29 0546) ?SpO2:  [96 %-99 %] 99 % (04/29 0546) ?Last BM Date : 07/11/21 ?General:   Alert,   pleasant and cooperative in NAD ?Abdomen: Nondistended.  Positive bowel sounds.  Mild right upper quadrant tenderness to palpation.  Liver palpated below the right costal margin. ?Extremities:  Without clubbing or edema.   ? ?Intake/Output from previous day: ?04/28 0701 - 04/29 0700 ?In: 1851.1 [P.O.:840; I.V.:1011.1] ?Out: 3 [Urine:3] ?Intake/Output this shift: ?No intake/output data recorded. ? ?Lab Results: ?Recent Labs  ?  07/10/21 ?1055 07/11/21 ?0444  ?WBC 4.2 4.4  ?HGB 10.1* 9.6*  ?HCT 27.6* 26.3*  ?PLT 140* 129*  ? ?BMET ?Recent Labs  ?  07/10/21 ?1055 07/11/21 ?0444  ?NA 134* 137  ?K 3.6 4.1  ?CL 100 104  ?CO2 26 26  ?GLUCOSE 106* 69*  ?BUN <5* 6  ?CREATININE 0.52* 0.54*  ?CALCIUM 8.1* 8.0*  ? ?LFT ?Recent Labs  ?  07/11/21 ?0444  ?PROT 5.6*  ?ALBUMIN 2.1*  ?AST 139*  ?ALT 49*  ?ALKPHOS 133*  ?BILITOT 11.3*  11.0*  ?BILIDIR 6.7*  ?IBILI 4.6*  ? ?PT/INR ?Recent Labs  ?  07/10/21 ?1055 07/11/21 ?0444  ?LABPROT 17.1* 17.5*  ?INR 1.4* 1.4*  ? ? ? ? ?Impression: 31 year old gentleman with acute alcohol induced hepatitis with associated pericholecystic and pancreatic edema. ? ?Cholelithiasis felt to be asymptomatic.  Now on prednisolone. ? ?Clinically much improved.  Tolerating diet. ? ? ?Recommendations: ? ?Prednisolone x28 days then taper; [place] labs -I will arrange for May 3 to assess efficacy. ? ?He is declined third-party help regarding alcohol use.  He is at high risk for recidivism ? ?From a GI standpoint, could discharge within the next 24 hours. ? ?Short interval follow-up with  Dr. Marletta Lor in about 2 weeks. ? ?

## 2021-07-12 NOTE — Progress Notes (Signed)
?Progress Note ? ? ?Patient: Jake King YIA:[id number] DOB: 11/17/1990 DOA: 07/10/2021     2 ?DOS: the patient was seen and examined on 07/12/2021 ?  ?Brief hospital course: ?DERREK PUFF is a 31 y.o. male with medical history significant of hypertension, tobacco abuse, alcohol abuse, gastroesophageal reflux disease, tobacco abuse, hepatic asteatosis and history of migraines; presented to the hospital secondary to abdominal pain and intractable nausea and vomiting.  Patient also has been noticed some yellow discoloration and reports general malaise.  He was seen by gastroenterology service as an outpatient with concerns for decompensation of alcoholic hepatitis and may be pancreatitis.  Referred to emergency department for further evaluation and management. ?CT abdomen demonstrating acute pancreatitis without pseudocyst or necrosis.  Lipase was elevated, patient with hyperbilirubinemia at 10.6 and findings suggestive of hepatic asteatosis and mild ascites. ?Patient denies chest pain, no hematemesis, no melena, no hematochezia, no dysuria, no hematuria, no focal weakness. ?  ?GI service was consulted and [facility] contacted to place patient in the hospital for further evaluation and management. ?  ? ?Assessment and Plan: ?* Alcoholic hepatitis ?-In the setting of alcohol abuse ?-Cessation counseling has been provided ?-Continue to maintain adequate hydration ?-MRCP demonstrating structural changes suggesting cirrhosis/steatosis and splenomegaly. ?-Mild pancreatitis also appreciated without pseudocyst, necrosis or obstruction.  There is increased wall thickening in the gallbladder but no gallstones or dilatation of CBD. ?-Bilirubin up to 11 currently; fractionated 6.7 ?-GI service has recommended initiation of prednisolone; continue supportive care and absolute abstinence. ?-Continue following electrolytes and replete as needed. ? ?Thrombocytopenia ([place]) ?- In the setting of alcohol abuse most  likely ?-Avoid the use of heparin products and NSAIDs ?-No overt bleeding appreciated currently ?-Continue to follow platelets count trend. ? ?Hypomagnesemia ?-In the setting of alcohol abuse most likely ?-Follow electrolytes and replete as needed. ? ?Alcohol induced acute pancreatitis ?-Elevated lipase and CT findings suggesting acute pancreatitis process at time of admission. ?-No gangrene or pseudocyst appreciated; corroborated with findings on MRI. ?-No further nausea or vomiting.  Has tolerated liquid diet.-Continue supportive care, IV fluids, antiemetics and analgesics. ?-Continue advancing diet as tolerated. ? ? ?GERD (gastroesophageal reflux disease) ?-Continue PPI ? ?Alcohol dependence with other alcohol-induced disorder ([place]) ?-Absolute cessation//abstination counseling provided ?-No acute withdrawal appreciated at this time ?-CIWA protocol has been ordered ?-Continue treatment with thiamine and folic acid  ? ?Tobacco abuse ?-Cessation counseling provided ?-Nicotine patch has been ordered. ? ?Hypertension ?-Stable overall. ?-Continue holding antihypertensive overnight ?-Provide fluid resuscitation ?-Follow-up blood pressure and treat accordingly. ? ? ? ?Subjective:  ?Has tolerated liquid diet; no chest pain, no nausea or vomiting.  Still having abdominal discomfort mid epigastric area and right upper quadrant(improving).  Patient is afebrile. ? ?Physical Exam: ?Vitals:  ? 07/11/21 0440 07/11/21 1337 07/11/21 1800 07/12/21 0546  ?BP: 111/71 105/70 112/80 112/80  ?Pulse: 93 74 76 72  ?Resp: 18 20 20 19   ?Temp: 98.5 ?F (36.9 ?C) 98.5 ?F (36.9 ?C) 98.4 ?F (36.9 ?C) 98 ?F (36.7 ?C)  ?TempSrc: Oral Oral Oral   ?SpO2: 96% 96% 98% 99%  ?Weight:      ?Height:      ?General exam: Alert, awake, oriented x 3; less jaundiced on exam.  Reports no nausea, no vomiting and so far has tolerated liquid diet. ?Respiratory system: Clear to auscultation. Respiratory effort normal.  Good saturation on room air. ?Cardiovascular  system:RRR. No murmurs, rubs, gallops.  No JVD. ?Gastrointestinal system: Abdomen is slightly distended; tender to palpation in midepigastric and  right upper quadrant; no guarding.  Positive bowel sounds.  ?Central nervous system: Alert and oriented. No focal neurological deficits. ?Extremities: No cyanosis or clubbing. ?Skin: No petechiae. ?Psychiatry: Judgement and insight appear normal. Mood & affect appropriate.  ? ?Data Reviewed: ?Most recent labs ?Magnesium 1.4 ?Bilirubin 11.3; direct bilirubin 6.7 and indirect bilirubin 4.6. ?Phosphorus 3.7 ?INR 1.4; PT 17.5 ?CBC demonstrating a WBCs of 4.4, hemoglobin 9.6 and platelet count 129 K ?Comprehensive metabolic panel demonstrating a sodium of 137, potassium 4.1, BUN 6, creatinine 0.54, AST 139, ALT 49, alk phos 133 ?Albumin 2.1 ? ?Family Communication: No family at bedside. ? ?Disposition: ?Status is: Inpatient ?Remains inpatient appropriate because: Still with elevated bilirubin, complaining of abdominal pain and having jaundice.  No nausea vomiting; starting to advance diet. ? ? Planned Discharge Destination: Home ? ? ?Author: ?Barton Dubois, MD ?07/12/2021 12:55 PM ? ?For on call review [email or web address].  ?

## 2021-07-13 DIAGNOSIS — R17 Unspecified jaundice: Secondary | ICD-10-CM | POA: Diagnosis not present

## 2021-07-13 DIAGNOSIS — K7010 Alcoholic hepatitis without ascites: Secondary | ICD-10-CM | POA: Diagnosis not present

## 2021-07-13 DIAGNOSIS — F10288 Alcohol dependence with other alcohol-induced disorder: Secondary | ICD-10-CM | POA: Diagnosis not present

## 2021-07-13 DIAGNOSIS — K852 Alcohol induced acute pancreatitis without necrosis or infection: Secondary | ICD-10-CM | POA: Diagnosis not present

## 2021-07-13 LAB — COMPREHENSIVE METABOLIC PANEL
ALT: 46 U/L — ABNORMAL HIGH (ref 0–44)
AST: 106 U/L — ABNORMAL HIGH (ref 15–41)
Albumin: 2.1 g/dL — ABNORMAL LOW (ref 3.5–5.0)
Alkaline Phosphatase: 141 U/L — ABNORMAL HIGH (ref 38–126)
Anion gap: 4 — ABNORMAL LOW (ref 5–15)
BUN: 9 mg/dL (ref 6–20)
CO2: 26 mmol/L (ref 22–32)
Calcium: 8 mg/dL — ABNORMAL LOW (ref 8.9–10.3)
Chloride: 106 mmol/L (ref 98–111)
Creatinine, Ser: 0.65 mg/dL (ref 0.61–1.24)
GFR, Estimated: >60 mL/min (ref >60)
Glucose, Bld: 114 mg/dL — ABNORMAL HIGH (ref 70–99)
Potassium: 4 mmol/L (ref 3.5–5.1)
Sodium: 136 mmol/L (ref 135–145)
Total Bilirubin: 8.1 mg/dL — ABNORMAL HIGH (ref 0.3–1.2)
Total Protein: 5.8 g/dL — ABNORMAL LOW (ref 6.5–8.1)

## 2021-07-13 LAB — CBC
HCT: 28 % — ABNORMAL LOW (ref 39.0–52.0)
Hemoglobin: 10 g/dL — ABNORMAL LOW (ref 13.0–17.0)
MCH: 38.6 pg — ABNORMAL HIGH (ref 26.0–34.0)
MCHC: 35.7 g/dL (ref 30.0–36.0)
MCV: 108.1 fL — ABNORMAL HIGH (ref 80.0–100.0)
Platelets: 145 10*3/uL — ABNORMAL LOW (ref 150–400)
RBC: 2.59 MIL/uL — ABNORMAL LOW (ref 4.22–5.81)
RDW: 20.6 % — ABNORMAL HIGH (ref 11.5–15.5)
WBC: 9.5 10*3/uL (ref 4.0–10.5)
nRBC: 0 % (ref 0.0–0.2)

## 2021-07-13 MED ORDER — DIPHENHYDRAMINE HCL 50 MG/ML IJ SOLN
25.0000 mg | Freq: Once | INTRAMUSCULAR | Status: AC
  Administered 2021-07-13: 25 mg via INTRAVENOUS
  Filled 2021-07-13: qty 1

## 2021-07-13 MED ORDER — NICOTINE 14 MG/24HR TD PT24: 14.0000 mg | MEDICATED_PATCH | Freq: Every day | TRANSDERMAL | 0 refills | Status: DC

## 2021-07-13 MED ORDER — TRAMADOL HCL 50 MG PO TABS: 50.0000 mg | ORAL_TABLET | Freq: Three times a day (TID) | ORAL | 0 refills | Status: DC | PRN

## 2021-07-13 MED ORDER — PREDNISOLONE 5 MG PO TABS: ORAL_TABLET | ORAL | 0 refills | Status: DC

## 2021-07-13 MED ORDER — THIAMINE HCL 100 MG PO TABS: 100.0000 mg | ORAL_TABLET | Freq: Every day | ORAL | 2 refills | Status: DC

## 2021-07-13 MED ORDER — FOLIC ACID 1 MG PO TABS
1.0000 mg | ORAL_TABLET | Freq: Every day | ORAL | 2 refills | Status: DC
Start: 2021-07-13 — End: 2022-03-02

## 2021-07-13 MED ORDER — ONDANSETRON 8 MG PO TBDP
8.0000 mg | ORAL_TABLET | Freq: Three times a day (TID) | ORAL | 0 refills | Status: DC | PRN
Start: 2021-07-13 — End: 2021-10-17

## 2021-07-13 NOTE — Assessment & Plan Note (Signed)
-   In the setting of alcohol hepatitis ?-No obstruction identified ?-Bilirubin level trending down at time of discharge. ?

## 2021-07-13 NOTE — Progress Notes (Signed)
Patient received al discharge instructions and verbalized understanding of all information.  Discharged to home with friend. ?

## 2021-07-13 NOTE — Discharge Summary (Signed)
?Physician Discharge Summary ?  ?Patient: Jake Carrowicholas M Blanchet MRN: [id number] DOB: [birth year]  ?Admit date:     07/10/2021  ?Discharge date: 07/13/21  ?Discharge Physician: Vassie LollCarlos Aldridge Krzyzanowski  ? ?PCP: Anabel HalonPatel, Rutwik K, MD  ? ?Recommendations at discharge:  ?Continue assisting patient with alcohol cessation ?Repeat complete metabolic panel to follow electrolytes, LFTs and renal function. ?Reassess blood pressure and adjust antihypertensive regimen as needed ?Make sure patient has follow-up with gastroenterology service as instructed. ?Continue assisting patient with smoking cessation. ?Repeat CBC to follow hemoglobin and platelet count/stability. ? ?Discharge Diagnoses: ?Principal Problem: ?  Alcoholic hepatitis ?Active Problems: ?  Hypertension ?  Tobacco abuse ?  Alcohol dependence with other alcohol-induced disorder ([facility]) ?  GERD (gastroesophageal reflux disease) ?  Alcohol induced acute pancreatitis ?  Hypomagnesemia ?  Thrombocytopenia ([facility]) ?  Hyperbilirubinemia ?  Jaundice ? ? ?Hospital Course: ?Jake King is a 31 y.o. male with medical history significant of hypertension, tobacco abuse, alcohol abuse, gastroesophageal reflux disease, tobacco abuse, hepatic asteatosis and history of migraines; presented to the hospital secondary to abdominal pain and intractable nausea and vomiting.  Patient also has been noticed some yellow discoloration and reports general malaise.  He was seen by gastroenterology service as an outpatient with concerns for decompensation of alcoholic hepatitis and may be pancreatitis.  Referred to emergency department for further evaluation and management. ?CT abdomen demonstrating acute pancreatitis without pseudocyst or necrosis.  Lipase was elevated, patient with hyperbilirubinemia at 10.6 and findings suggestive of hepatic asteatosis and mild ascites. ?Patient denies chest pain, no hematemesis, no melena, no hematochezia, no dysuria, no hematuria, no focal weakness. ?  ?GI service  was consulted and [facility] contacted to place patient in the hospital for further evaluation and management. ? ?Assessment and Plan: ?* Alcoholic hepatitis ?-In the setting of alcohol abuse ?-Cessation counseling and complete abstinence has been encouraged.   ?-Instructed to maintain adequate hydration. ?-MRCP demonstrating structural changes suggesting cirrhosis/steatosis and splenomegaly. ?-Mild pancreatitis also appreciated without pseudocyst, necrosis or obstruction.  There is increased wall thickening in the gallbladder but no gallstones or dilatation of CBD. ?-Bilirubin has started to trend down; at discharge 8.3  ?-GI service has recommended treatment and tapering with prednisolone.  ?-Continue to follow electrolytes and LFTs with further repletion as needed.  ?-Outpatient follow-up with gastroenterology in 2 weeks. ? ?Jaundice ?- Secondary to hyperbilirubinemia ?-No malignancy or obstruction appreciated. ?-Improving. ?-Patient advised to follow complete alcohol abstinence. ? ?Hyperbilirubinemia ?- In the setting of alcohol hepatitis ?-No obstruction identified ?-Bilirubin level trending down at time of discharge. ? ?Thrombocytopenia ([facility]) ?-In the setting of alcohol abuse most likely ?-Avoid the use of heparin products and NSAIDs ?-No overt bleeding appreciated  ?-Would recommend repeat CBC to follow hemoglobin and platelet count stability. ? ?Hypomagnesemia ?-In the setting of alcohol abuse most likely ?-Repleted. ? ?Alcohol induced acute pancreatitis ?-Elevated lipase and CT findings suggesting acute pancreatitis process at time of admission. ?-No gangrene or pseudocyst appreciated; corroborated with findings on MRI. ?-No further nausea or vomiting.   ?-Patient advised to follow heart healthy diet and to maintain adequate hydration. ?-Tolerating regular diet at discharge. ?-As needed analgesics and antiemetics provided at discharge. ? ? ?GERD (gastroesophageal reflux disease) ?-Continue PPI ? ?Alcohol  dependence with other alcohol-induced disorder ([facility]) ?-Absolute cessation//abstinence counseling provided ?-No acute withdrawal appreciated during this hospitalization ?-Continue treatment with thiamine and folic acid  ? ?Tobacco abuse ?-Cessation counseling provided ?-Nicotine patch has been prescribed at discharge. ? ?Hypertension ?-Stable overall. ?-Continue to  follow heart healthy diet ?-At discharge resume home antihypertensive regimen. ? ? ?Consultants: Gastroenterology service ?Procedures performed: See below for x-ray reports. ?Disposition: Home ?Diet recommendation: Heart healthy diet. ? ?DISCHARGE MEDICATION: ?Allergies as of 07/13/2021   ? ?   Reactions  ? Atropine Swelling  ? Eye Drops from when he was a baby  ? Other Swelling  ? SULFITES IN ALCOHOLS   ? ?  ? ?  ?Medication List  ?  ? ?TAKE these medications   ? ?albuterol 108 (90 Base) MCG/ACT inhaler ?Commonly known as: VENTOLIN HFA ?Inhale 2 puffs into the lungs every 4 (four) hours as needed for wheezing or shortness of breath. ?  ?cycloSPORINE 0.05 % ophthalmic emulsion ?Commonly known as: RESTASIS ?Place 1 drop into both eyes 2 (two) times daily. ?  ?folic acid 1 MG tablet ?Commonly known as: FOLVITE ?Take 1 tablet (1 mg total) by mouth daily. ?  ?multivitamin with minerals tablet ?Take 1 tablet by mouth in the morning. ?  ?nicotine 14 mg/24hr patch ?Commonly known as: NICODERM CQ - dosed in mg/24 hours ?Place 1 patch (14 mg total) onto the skin daily. ?  ?ondansetron 8 MG disintegrating tablet ?Commonly known as: ZOFRAN-ODT ?Take 1 tablet (8 mg total) by mouth every 8 (eight) hours as needed for nausea or vomiting. ?  ?pantoprazole 40 MG tablet ?Commonly known as: PROTONIX ?Take 1 tablet (40 mg total) by mouth 2 (two) times daily. Take 30 minutes before breakfast ?  ?prednisoLONE 5 MG Tabs tablet ?Start taking 40mg  daily and decrease by 10 mg every 7 days and when completed 7 days with 2 tablets, take 5 daily for 7 days and stop prednisolone. ?   ?prednisoLONE acetate 1 % ophthalmic suspension ?Commonly known as: PRED FORTE ?1 drop 4 (four) times daily. ?  ?thiamine 100 MG tablet ?Take 1 tablet (100 mg total) by mouth daily. ?  ?traMADol 50 MG tablet ?Commonly known as: Ultram ?Take 1 tablet (50 mg total) by mouth every 8 (eight) hours as needed for severe pain. ?  ?verapamil 120 MG CR tablet ?Commonly known as: CALAN-SR ?Take 1 tablet (120 mg total) by mouth at bedtime. ?  ?Vitamin D 50 MCG (2000 UT) Caps ?Take 2,000 Units by mouth in the morning. ?  ? ?  ? ? Follow-up Information   ? ? , DO. Schedule an appointment as soon as possible for a visit in 2 week(s).   ?Specialty: Gastroenterology ?Contact information: ?[address] ?[place] ?[phone] ? ? ?  ?  ? ?  ?  ? ?  ? ?Discharge Exam: ?Filed Weights  ? 07/10/21 1103  ?Weight: 88 kg  ? ?General exam: Alert, awake, oriented x 3; less jaundiced on exam.  Reports no nausea, no vomiting and so far has tolerated liquid diet. ?Respiratory system: Clear to auscultation. Respiratory effort normal.  Good saturation on room air. ?Cardiovascular system:RRR. No murmurs, rubs, gallops.  No JVD. ?Gastrointestinal system: Abdomen is slightly distended; tender to palpation in midepigastric and right upper quadrant; no guarding.  Positive bowel sounds.  ?Central nervous system: Alert and oriented. No focal neurological deficits. ?Extremities: No cyanosis or clubbing. ?Skin: No petechiae. ?Psychiatry: Judgement and insight appear normal. Mood & affect appropriate.  ? ?Condition at discharge: Stable and improved. ? ?The results of significant diagnostics from this hospitalization (including imaging, microbiology, ancillary and laboratory) are listed below for reference.  ? ?Imaging Studies: ?CT Abdomen Pelvis W Contrast ? ?Result Date: 07/10/2021 ?CLINICAL DATA:  Abdominal pain EXAM: CT ABDOMEN AND PELVIS WITH CONTRAST TECHNIQUE: Multidetector CT imaging of the abdomen and pelvis was  performed using the standard protocol following bolus administration of intravenous contrast. RADIATION DOSE REDUCTION: This exam was performed according to the departmental dose-optimization program which includes auto

## 2021-07-13 NOTE — Assessment & Plan Note (Signed)
-   Secondary to hyperbilirubinemia ?-No malignancy or obstruction appreciated. ?-Improving. ?-Patient advised to follow complete alcohol abstinence. ?

## 2021-07-14 ENCOUNTER — Telehealth: Payer: Self-pay

## 2021-07-14 NOTE — Telephone Encounter (Signed)
Transition Care Management Follow-up Telephone Call ?Date of discharge and from where: 07/13/21 [facility] ?How have you been since you were released from the hospital? Ok not to bad ?Any questions or concerns? No ? ?Items Reviewed: ?Did the pt receive and understand the discharge instructions provided? Yes  ?Medications obtained and verified? Yes  ?Other?    ?Any new allergies since your discharge? No  ?Dietary orders reviewed? Yes ?Do you have support at home? Yes  ? ?Home Care and Equipment/Supplies: ?Were home health services ordered? not applicable ?If so, what is the name of the agency? N/a  ?Has the agency set up a time to come to the patient's home? not applicable ?Were any new equipment or medical supplies ordered?  No ?What is the name of the medical supply agency? N/a ?Were you able to get the supplies/equipment? not applicable ?Do you have any questions related to the use of the equipment or supplies? No ? ?Functional Questionnaire: (I = Independent and D = Dependent) ?ADLs: i ? ?Bathing/Dressing- i ? ?Meal Prep- i ? ?Eating- i ? ?Maintaining continence- i ? ?Transferring/Ambulation- i ? ?Managing Meds- i ? ?Follow up appointments reviewed: ? ?PCP Hospital f/u appt confirmed? Yes  Scheduled to see Malachi Bonds on 07/15/21 @ 3:20. ?Specialist Hospital f/u appt confirmed? No  Patient to schedule with Dr.Carver ?Are transportation arrangements needed? No  ?If their condition worsens, is the pt aware to call PCP or go to the Emergency Dept.? Yes ?Was the patient provided with contact information for the PCP's office or ED? Yes ?Was to pt encouraged to call back with questions or concerns? Yes  ?

## 2021-07-15 ENCOUNTER — Ambulatory Visit: Payer: Managed Care, Other (non HMO) | Admitting: Family Medicine

## 2021-07-15 ENCOUNTER — Encounter: Payer: Self-pay | Admitting: Family Medicine

## 2021-07-15 VITALS — BP 114/70 | HR 62 | Ht 70.5 in | Wt 200.0 lb

## 2021-07-15 DIAGNOSIS — K7010 Alcoholic hepatitis without ascites: Secondary | ICD-10-CM

## 2021-07-15 DIAGNOSIS — G4700 Insomnia, unspecified: Secondary | ICD-10-CM

## 2021-07-15 DIAGNOSIS — K852 Alcohol induced acute pancreatitis without necrosis or infection: Secondary | ICD-10-CM

## 2021-07-15 MED ORDER — TRAZODONE HCL 50 MG PO TABS: 50.0000 mg | ORAL_TABLET | Freq: Every evening | ORAL | 3 refills | Status: DC | PRN

## 2021-07-15 NOTE — Patient Instructions (Addendum)
I appreciate the opportunity to provide care to you today! ?  ? ? ?labs: please stop by the lab to have your blood drawn for CBC and CMP. ? ?Please pick up your medication for sleep at the pharmacy ? ? ? ? ?  ?It was a pleasure to see you and I look forward to continuing to work together on your health and well-being. ?Please do not hesitate to call the office if you need care or have questions about your care. ?  ?Have a wonderful day and week. ?With Gratitude, ?Gilmore Laroche MSN, FNP-BC  ?

## 2021-07-15 NOTE — Assessment & Plan Note (Signed)
Trazodone ordered

## 2021-07-15 NOTE — Assessment & Plan Note (Addendum)
-  Labs and Imaging reviewed ?-Pending CBC and CMP ?-Patient reported alcohol cessation ?

## 2021-07-15 NOTE — Progress Notes (Signed)
? ?Established Patient Office Visit ? ?Subjective:  ?Patient ID: Jake King, male    DOB: 1990-09-19  Age: 31 y.o. MRN: [id number] ? ?CC:  ?Chief Complaint  ?Patient presents with  ? Follow-up  ?  Pt was seen in ER on 07/10/21 discharged on 07/13/21. States he has been feeling well since then.   ? ? ?HPI ?Jake King is a 31 y.o. male with a history of significant hypertension, tobacco abuse, alcohol abuse, gastroesophageal reflux disease, hepatic steatosis, and history of migraines; presents for hospital f/u. he was admitted to the ED on [id number] for Alcoholic hepatitis. CT abdomen demonstrating acute pancreatitis without pseudocyst or necrosis. Lipase was elevated, with hyperbilirubinemia at 10.6, and findings suggestive of hepatic steatosis and mild ascites.  ? ?At today's visit, the patient reports doing well. He is staying hydrated and reported alcohol cessation. The patient says he drank 12 packs of beer daily before and 12 packs after work. He notes that he has stopped drinking since being discharged from the hospital, and his last drink was a week ago. He notes that he is more energetic and has more appetite to eat. He is following up with GI on September 11, 2021. He reports taking all his discharged medication except tramadol. He notes a history of drug abuse between the ages of [age], and since being clean, he has not wanted to take any opioids. ? ?He reports difficulty sleeping since he stopped drinking. He said that drinking helped him fall asleep, and since quitting, he has had difficulty falling asleep. He denies chest pain, hematemesis, melena, hematochezia, dysuria, hematuria,  and focal weakness.  ? ?Past Medical History:  ?Diagnosis Date  ? 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 Hepatitis   ? Hypertension   ? Migraine   ? Right knee injury   ? Seizures ([place])   ? Vitamin D deficiency   ? ? ?Past Surgical History:  ?Procedure Laterality Date  ? BIOPSY  06/10/2021  ? Procedure: BIOPSY;  Surgeon:  Eloise Harman, DO;  [facility]: [facility];  Service: Endoscopy;;  ? ESOPHAGOGASTRODUODENOSCOPY (EGD) WITH PROPOFOL N/A 06/10/2021  ? Procedure: ESOPHAGOGASTRODUODENOSCOPY (EGD) WITH PROPOFOL;  Surgeon: Eloise Harman, DO;  Location: [facility];  Service: Endoscopy;  Laterality: N/A;  11:15am, ASA 2  ? NASAL SINUS SURGERY    ? NASAL SINUS SURGERY  2009  ? ? ?Family History  ?Problem Relation Age of Onset  ? Other Mother   ?     "Twisted Colon"  ? Mental illness Mother   ? Diabetes Father   ? Hypertension Father   ? Pneumonia Father   ? Liver disease Father   ?     unsure what  ? Ulcerative colitis Paternal Grandmother   ? Colon polyps Paternal Grandmother   ? Congestive Heart Failure Paternal Grandfather   ? Hypertension Paternal Grandfather   ? Diabetes Paternal Grandfather   ? Multiple sclerosis Paternal Grandfather   ? Colon cancer Neg Hx   ? ? ?Social History  ? ?Socioeconomic History  ? Marital status: Significant Other  ?  Spouse name: Caryl Pina  ? Number of children: 1  ? Years of education: GED  ? Highest education level: Not on file  ?Occupational History  ? Occupation: [employer]  ?Tobacco Use  ? Smoking status: Every Day  ?  Packs/day: 0.50  ?  Types: Cigarettes  ? Smokeless tobacco: Former  ?Substance and Sexual Activity  ? Alcohol use: Yes  ?  Alcohol/week: 2.0 - 3.0 standard drinks  ?  Types: 2 - 3 Cans of beer per week  ?  Comment: 3-4 beers every night   ? Drug use: Not Currently  ?  Comment: no drugs since 2018  ? Sexual activity: Not on file  ?Other Topics Concern  ? Not on file  ?Social History Narrative  ? Lives with Lillia Corporal Caryl Pina) and their kids  ? Caffeine use: none  ? Right handed   ? ?Social Determinants of Health  ? ?Financial Resource Strain: Not on file  ?Food Insecurity: Not on file  ?Transportation Needs: Not on file  ?Physical Activity: Not on file  ?Stress: Not on file  ?Social Connections: Not on file  ?Intimate Partner Violence: Not on file  ? ? ?Outpatient Medications  Prior to Visit  ?Medication Sig Dispense Refill  ? albuterol (VENTOLIN HFA) 108 (90 Base) MCG/ACT inhaler Inhale 2 puffs into the lungs every 4 (four) hours as needed for wheezing or shortness of breath. 18 g 5  ? Cholecalciferol (VITAMIN D) 50 MCG (2000 UT) CAPS Take 2,000 Units by mouth in the morning.    ? cycloSPORINE (RESTASIS) 0.05 % ophthalmic emulsion Place 1 drop into both eyes 2 (two) times daily.    ? folic acid (FOLVITE) 1 MG tablet Take 1 tablet (1 mg total) by mouth daily. 30 tablet 2  ? Multiple Vitamins-Minerals (MULTIVITAMIN WITH MINERALS) tablet Take 1 tablet by mouth in the morning.    ? nicotine (NICODERM CQ - DOSED IN MG/24 HOURS) 14 mg/24hr patch Place 1 patch (14 mg total) onto the skin daily. 28 patch 0  ? ondansetron (ZOFRAN-ODT) 8 MG disintegrating tablet Take 1 tablet (8 mg total) by mouth every 8 (eight) hours as needed for nausea or vomiting. 20 tablet 0  ? pantoprazole (PROTONIX) 40 MG tablet Take 1 tablet (40 mg total) by mouth 2 (two) times daily. Take 30 minutes before breakfast 60 tablet 5  ? prednisoLONE 5 MG TABS tablet Start taking 40mg  daily and decrease by 10 mg every 7 days and when completed 7 days with 2 tablets, take 5 daily for 7 days and stop prednisolone. 175 tablet 0  ? prednisoLONE acetate (PRED FORTE) 1 % ophthalmic suspension 1 drop 4 (four) times daily.    ? thiamine 100 MG tablet Take 1 tablet (100 mg total) by mouth daily. 30 tablet 2  ? verapamil (CALAN-SR) 120 MG CR tablet Take 1 tablet (120 mg total) by mouth at bedtime. 90 tablet 1  ? traMADol (ULTRAM) 50 MG tablet Take 1 tablet (50 mg total) by mouth every 8 (eight) hours as needed for severe pain. 20 tablet 0  ? ?No facility-administered medications prior to visit.  ? ? ?Allergies  ?Allergen Reactions  ? Atropine Swelling  ?  Eye Drops from when he was a baby  ? Other Swelling  ?  SULFITES IN ALCOHOLS   ? ? ?ROS ?Review of Systems  ?Constitutional:  Positive for fatigue. Negative for chills and fever.   ?HENT:  Negative for sinus pressure, sinus pain, sneezing and sore throat.   ?Eyes:   ?     Yellowing of the conjutiva  ?Respiratory:  Positive for wheezing. Negative for chest tightness and shortness of breath.   ?Cardiovascular:  Negative for chest pain and palpitations.  ?Gastrointestinal:  Positive for abdominal pain (minor) and constipation. Negative for diarrhea, nausea and vomiting.  ?Endocrine: Negative for polydipsia, polyphagia and polyuria (staying hydration).  ?Genitourinary:  Negative for  difficulty urinating, dysuria and urgency.  ?Musculoskeletal:  Positive for back pain (upper back) and neck pain (hx of spinal stenosis and degenerative disc disease).  ?Neurological:  Positive for weakness. Negative for dizziness and headaches.  ?Psychiatric/Behavioral:  Positive for sleep disturbance. Negative for confusion and self-injury.   ? ?  ?Objective:  ?  ?Physical Exam ?Constitutional:   ?   Appearance: Normal appearance.  ?HENT:  ?   Head: Normocephalic.  ?   Right Ear: External ear normal.  ?   Left Ear: External ear normal.  ?   Nose: No congestion or rhinorrhea.  ?   Mouth/Throat:  ?   Pharynx: No oropharyngeal exudate or posterior oropharyngeal erythema.  ?Eyes:  ?   Conjunctiva/sclera:  ?   Right eye: Right conjunctiva is not injected (yellowing discoloration).  ?   Left eye: Left conjunctiva is not injected (yellowing discoloration).  ? ?Cardiovascular:  ?   Rate and Rhythm: Normal rate and regular rhythm.  ?   Pulses: Normal pulses.  ?   Heart sounds: Normal heart sounds.  ?Pulmonary:  ?   Effort: Pulmonary effort is normal.  ?   Breath sounds: Normal breath sounds.  ?Abdominal:  ?   General: Bowel sounds are normal.  ?   Palpations: Abdomen is soft. There is hepatomegaly.  ?   Tenderness: There is generalized abdominal tenderness and tenderness in the right upper quadrant, right lower quadrant and left lower quadrant.  ?Skin: ?   Coloration: Skin is not jaundiced.  ?Neurological:  ?   Mental  Status: He is alert and oriented to person, place, and time.  ? ? ?BP 114/70   Pulse 62   Ht 5' 10.5" (1.791 m)   Wt 200 lb (90.7 kg)   SpO2 98%   BMI 28.29 kg/m?  ?Wt Readings from Last 3 Encounters:  ?07/15/21 20

## 2021-07-16 LAB — CMP14+EGFR
ALT: 55 IU/L — ABNORMAL HIGH (ref 0–44)
AST: 109 IU/L — ABNORMAL HIGH (ref 0–40)
Albumin/Globulin Ratio: 0.9 — ABNORMAL LOW (ref 1.2–2.2)
Albumin: 3.1 g/dL — ABNORMAL LOW (ref 4.1–5.2)
Alkaline Phosphatase: 169 IU/L — ABNORMAL HIGH (ref 44–121)
BUN/Creatinine Ratio: 12 (ref 9–20)
BUN: 9 mg/dL (ref 6–20)
Bilirubin Total: 7.5 mg/dL — ABNORMAL HIGH (ref 0.0–1.2)
CO2: 27 mmol/L (ref 20–29)
Calcium: 8.7 mg/dL (ref 8.7–10.2)
Chloride: 101 mmol/L (ref 96–106)
Creatinine, Ser: 0.73 mg/dL — ABNORMAL LOW (ref 0.76–1.27)
Globulin, Total: 3.6 g/dL (ref 1.5–4.5)
Glucose: 169 mg/dL — ABNORMAL HIGH (ref 70–99)
Potassium: 4.8 mmol/L (ref 3.5–5.2)
Sodium: 137 mmol/L (ref 134–144)
Total Protein: 6.7 g/dL (ref 6.0–8.5)
eGFR: 126 mL/min/{1.73_m2} (ref >59)

## 2021-07-17 LAB — HEMOCHROMATOSIS DNA-PCR(C282Y,H63D)

## 2021-07-18 ENCOUNTER — Other Ambulatory Visit: Payer: Self-pay | Admitting: Family Medicine

## 2021-07-18 NOTE — Progress Notes (Signed)
Inform the patient that his liver counts are elevated, but his bilirubin levels are decreasing. Please encourage him to follow up closely with GI and continue taking his medications as prescribed.

## 2021-08-01 ENCOUNTER — Ambulatory Visit (INDEPENDENT_AMBULATORY_CARE_PROVIDER_SITE_OTHER): Payer: Managed Care, Other (non HMO) | Admitting: Nurse Practitioner

## 2021-08-01 ENCOUNTER — Encounter: Payer: Self-pay | Admitting: Nurse Practitioner

## 2021-08-01 DIAGNOSIS — J0191 Acute recurrent sinusitis, unspecified: Secondary | ICD-10-CM | POA: Diagnosis not present

## 2021-08-01 MED ORDER — AMOXICILLIN-POT CLAVULANATE 875-125 MG PO TABS: 1.0000 | ORAL_TABLET | Freq: Two times a day (BID) | ORAL | 0 refills | Status: AC

## 2021-08-01 MED ORDER — FLUTICASONE PROPIONATE 50 MCG/ACT NA SUSP: 2.0000 | Freq: Every day | NASAL | 6 refills | Status: DC

## 2021-08-01 NOTE — Progress Notes (Signed)
Virtual Visit via Telephone Note  I connected with Jake King @ on 08/01/21 at 1:00pm by telephone and verified that I am speaking with the correct person using two identifiers.  I spent 8 minutes on this telephone encounter  Location: Patient: Home Provider: Office   I discussed the limitations, risks, security and privacy concerns of performing an evaluation and management service by telephone and the availability of in person appointments. I also discussed with the patient that there may be a patient responsible charge related to this service. The patient expressed understanding and agreed to proceed.   History of Present Illness: Mr. Jake King with past medical history of GERD, alcoholic hepatitis, alcoholic induced acute pancreatitis, thrombocytopenia complains of cough, nasal congestion, facial swelling since about a week ago.  Stated that was hospitalized 3 weeks ago for alcoholic hepatitis, still on predinisolone, he has given up drinking and cutting back on smoking .  Patient stated that he has cough  with yellowish colored sputum , nasal congestion, facial swelling and pain, yellowish nasal drainage, since the past 1 week, has had sinus surgery in the past,, has been taking ibuprofen as needed for fever, psudafed 30mg  for sinus congestion.has sob, wheezing, albuterol inhaler as needed has been helping his symptoms.   Observations/Objective:   Assessment and Plan: Acute recurrent sinusitis Symptoms started about a week ago, currently on prednisolone for alcoholic hepatitis. Rx Augmentin 875- 125 mg 1 tablet twice daily for 10 days OTC Motrin as needed for fever and pain Flonase nasal spray 2 spray daily Use saline nasal spray as needed.   Follow Up Instructions:    I discussed the assessment and treatment plan with the patient. The patient was provided an opportunity to ask questions and all were answered. The patient agreed with the plan and demonstrated an  understanding of the instructions.   The patient was advised to call back or seek an in-person evaluation if the symptoms worsen or if the condition fails to improve as anticipated.

## 2021-08-01 NOTE — Assessment & Plan Note (Addendum)
Symptoms started about a week ago, currently on prednisolone for alcoholic hepatitis. Rx Augmentin 875- 125 mg 1 tablet twice daily for 10 days OTC Motrin as needed for fever and pain Flonase nasal spray 2 spray daily Use saline nasal spray as needed.

## 2021-08-14 ENCOUNTER — Other Ambulatory Visit: Payer: Self-pay

## 2021-08-14 DIAGNOSIS — R7989 Other specified abnormal findings of blood chemistry: Secondary | ICD-10-CM

## 2021-08-14 DIAGNOSIS — K7010 Alcoholic hepatitis without ascites: Secondary | ICD-10-CM

## 2021-08-14 DIAGNOSIS — R17 Unspecified jaundice: Secondary | ICD-10-CM

## 2021-09-01 ENCOUNTER — Ambulatory Visit: Payer: Managed Care, Other (non HMO) | Admitting: Internal Medicine

## 2021-09-05 ENCOUNTER — Encounter: Payer: Self-pay | Admitting: Internal Medicine

## 2021-09-05 ENCOUNTER — Ambulatory Visit: Payer: Managed Care, Other (non HMO) | Admitting: Internal Medicine

## 2021-09-05 VITALS — BP 118/82 | HR 83 | Resp 18 | Ht 70.0 in | Wt 189.8 lb

## 2021-09-05 DIAGNOSIS — K219 Gastro-esophageal reflux disease without esophagitis: Secondary | ICD-10-CM | POA: Diagnosis not present

## 2021-09-05 DIAGNOSIS — K7010 Alcoholic hepatitis without ascites: Secondary | ICD-10-CM | POA: Diagnosis not present

## 2021-09-05 DIAGNOSIS — F10288 Alcohol dependence with other alcohol-induced disorder: Secondary | ICD-10-CM

## 2021-09-05 DIAGNOSIS — I10 Essential (primary) hypertension: Secondary | ICD-10-CM | POA: Diagnosis not present

## 2021-09-05 DIAGNOSIS — J3089 Other allergic rhinitis: Secondary | ICD-10-CM

## 2021-09-05 DIAGNOSIS — G4700 Insomnia, unspecified: Secondary | ICD-10-CM

## 2021-09-05 DIAGNOSIS — D696 Thrombocytopenia, unspecified: Secondary | ICD-10-CM

## 2021-09-05 MED ORDER — NALTREXONE HCL 50 MG PO TABS: 50.0000 mg | ORAL_TABLET | Freq: Every day | ORAL | 2 refills | Status: DC

## 2021-09-05 MED ORDER — AZELASTINE HCL 0.1 % NA SOLN: 2.0000 | Freq: Two times a day (BID) | NASAL | 5 refills | Status: DC

## 2021-09-06 DIAGNOSIS — J309 Allergic rhinitis, unspecified: Secondary | ICD-10-CM | POA: Insufficient documentation

## 2021-09-06 LAB — CBC WITH DIFFERENTIAL/PLATELET
Absolute Monocytes: 399 cells/uL (ref 200–950)
Basophils Absolute: 42 cells/uL (ref 0–200)
Basophils Relative: 1 %
Eosinophils Absolute: 101 cells/uL (ref 15–500)
Eosinophils Relative: 2.4 %
HCT: 43.8 % (ref 38.5–50.0)
Hemoglobin: 15.2 g/dL (ref 13.2–17.1)
Lymphs Abs: 1273 cells/uL (ref 850–3900)
MCH: 33 pg (ref 27.0–33.0)
MCHC: 34.7 g/dL (ref 32.0–36.0)
MCV: 95 fL (ref 80.0–100.0)
MPV: 10.7 fL (ref 7.5–12.5)
Monocytes Relative: 9.5 %
Neutro Abs: 2386 cells/uL (ref 1500–7800)
Neutrophils Relative %: 56.8 %
Platelets: 121 10*3/uL — ABNORMAL LOW (ref 140–400)
RBC: 4.61 10*6/uL (ref 4.20–5.80)
RDW: 12.4 % (ref 11.0–15.0)
Total Lymphocyte: 30.3 %
WBC: 4.2 10*3/uL (ref 3.8–10.8)

## 2021-09-06 LAB — COMPREHENSIVE METABOLIC PANEL
AG Ratio: 1 (calc) (ref 1.0–2.5)
ALT: 74 U/L — ABNORMAL HIGH (ref 9–46)
AST: 215 U/L — ABNORMAL HIGH (ref 10–40)
Albumin: 3.3 g/dL — ABNORMAL LOW (ref 3.6–5.1)
Alkaline phosphatase (APISO): 241 U/L — ABNORMAL HIGH (ref 36–130)
BUN/Creatinine Ratio: 6 (calc) (ref 6–22)
BUN: 4 mg/dL — ABNORMAL LOW (ref 7–25)
CO2: 25 mmol/L (ref 20–32)
Calcium: 8.6 mg/dL (ref 8.6–10.3)
Chloride: 104 mmol/L (ref 98–110)
Creat: 0.72 mg/dL (ref 0.60–1.24)
Globulin: 3.4 g/dL (calc) (ref 1.9–3.7)
Glucose, Bld: 99 mg/dL (ref 65–99)
Potassium: 4 mmol/L (ref 3.5–5.3)
Sodium: 141 mmol/L (ref 135–146)
Total Bilirubin: 2.8 mg/dL — ABNORMAL HIGH (ref 0.2–1.2)
Total Protein: 6.7 g/dL (ref 6.1–8.1)

## 2021-09-06 NOTE — Assessment & Plan Note (Signed)
Nasal congestion and coughing spells, although, coughing is most likely due to gastritis/GERD -nasal symptoms are likely due to allergies Did not tolerate Flonase, switched to azelastine nasal spray

## 2021-09-06 NOTE — Assessment & Plan Note (Signed)
BP Readings from Last 1 Encounters:  09/05/21 118/82   Well-controlled now with Verapamil Counseled for compliance with the medications Advised DASH diet and moderate exercise/walking, at least 150 mins/week

## 2021-09-06 NOTE — Assessment & Plan Note (Signed)
Likely due to alcohol abuse Continue thiamine and folic acid [place]ia likely due to borderline low platelets

## 2021-09-06 NOTE — Assessment & Plan Note (Addendum)
Likely due to repeated alcohol use, needs to quit alcohol abuse On trazodone as needed

## 2021-09-11 ENCOUNTER — Ambulatory Visit: Payer: Managed Care, Other (non HMO) | Admitting: Gastroenterology

## 2021-09-19 ENCOUNTER — Encounter: Payer: Self-pay | Admitting: Internal Medicine

## 2021-10-17 ENCOUNTER — Emergency Department (HOSPITAL_COMMUNITY): Payer: Managed Care, Other (non HMO)

## 2021-10-17 ENCOUNTER — Observation Stay (HOSPITAL_COMMUNITY)
Admission: EM | Admit: 2021-10-17 | Discharge: 2021-10-18 | Disposition: A | Discharge status: Home / Self Care | Payer: Managed Care, Other (non HMO) | Attending: Internal Medicine | Admitting: Internal Medicine

## 2021-10-17 ENCOUNTER — Other Ambulatory Visit: Payer: Self-pay

## 2021-10-17 ENCOUNTER — Encounter (HOSPITAL_COMMUNITY): Payer: Self-pay | Admitting: Emergency Medicine

## 2021-10-17 DIAGNOSIS — Z720 Tobacco use: Secondary | ICD-10-CM

## 2021-10-17 DIAGNOSIS — Z79899 Other long term (current) drug therapy: Secondary | ICD-10-CM | POA: Diagnosis not present

## 2021-10-17 DIAGNOSIS — E440 Moderate protein-calorie malnutrition: Secondary | ICD-10-CM | POA: Diagnosis not present

## 2021-10-17 DIAGNOSIS — F17210 Nicotine dependence, cigarettes, uncomplicated: Secondary | ICD-10-CM | POA: Diagnosis not present

## 2021-10-17 DIAGNOSIS — K7011 Alcoholic hepatitis with ascites: Secondary | ICD-10-CM

## 2021-10-17 DIAGNOSIS — K7010 Alcoholic hepatitis without ascites: Principal | ICD-10-CM

## 2021-10-17 DIAGNOSIS — R1013 Epigastric pain: Secondary | ICD-10-CM | POA: Diagnosis not present

## 2021-10-17 DIAGNOSIS — I10 Essential (primary) hypertension: Secondary | ICD-10-CM | POA: Diagnosis not present

## 2021-10-17 DIAGNOSIS — F1010 Alcohol abuse, uncomplicated: Secondary | ICD-10-CM | POA: Diagnosis not present

## 2021-10-17 DIAGNOSIS — Z28310 Unvaccinated for covid-19: Secondary | ICD-10-CM | POA: Diagnosis not present

## 2021-10-17 DIAGNOSIS — K7290 Hepatic failure, unspecified without coma: Secondary | ICD-10-CM | POA: Diagnosis present

## 2021-10-17 DIAGNOSIS — K219 Gastro-esophageal reflux disease without esophagitis: Secondary | ICD-10-CM

## 2021-10-17 DIAGNOSIS — K766 Portal hypertension: Secondary | ICD-10-CM | POA: Diagnosis not present

## 2021-10-17 DIAGNOSIS — R1011 Right upper quadrant pain: Secondary | ICD-10-CM | POA: Diagnosis present

## 2021-10-17 LAB — CBC
HCT: 42.2 % (ref 39.0–52.0)
Hemoglobin: 14.3 g/dL (ref 13.0–17.0)
MCH: 30.5 pg (ref 26.0–34.0)
MCHC: 33.9 g/dL (ref 30.0–36.0)
MCV: 90 fL (ref 80.0–100.0)
Platelets: 115 10*3/uL — ABNORMAL LOW (ref 150–400)
RBC: 4.69 MIL/uL (ref 4.22–5.81)
RDW: 16.2 % — ABNORMAL HIGH (ref 11.5–15.5)
WBC: 5.1 10*3/uL (ref 4.0–10.5)
nRBC: 0 % (ref 0.0–0.2)

## 2021-10-17 LAB — COMPREHENSIVE METABOLIC PANEL
ALT: 75 U/L — ABNORMAL HIGH (ref 0–44)
AST: 209 U/L — ABNORMAL HIGH (ref 15–41)
Albumin: 2.6 g/dL — ABNORMAL LOW (ref 3.5–5.0)
Alkaline Phosphatase: 202 U/L — ABNORMAL HIGH (ref 38–126)
Anion gap: 7 (ref 5–15)
BUN: <5 mg/dL — ABNORMAL LOW (ref 6–20)
CO2: 26 mmol/L (ref 22–32)
Calcium: 8 mg/dL — ABNORMAL LOW (ref 8.9–10.3)
Chloride: 102 mmol/L (ref 98–111)
Creatinine, Ser: 0.6 mg/dL — ABNORMAL LOW (ref 0.61–1.24)
GFR, Estimated: >60 mL/min (ref >60)
Glucose, Bld: 93 mg/dL (ref 70–99)
Potassium: 3.9 mmol/L (ref 3.5–5.1)
Sodium: 135 mmol/L (ref 135–145)
Total Bilirubin: 6.1 mg/dL — ABNORMAL HIGH (ref 0.3–1.2)
Total Protein: 6.8 g/dL (ref 6.5–8.1)

## 2021-10-17 LAB — URINALYSIS, ROUTINE W REFLEX MICROSCOPIC
Bilirubin Urine: NEGATIVE
Glucose, UA: NEGATIVE mg/dL
Hgb urine dipstick: NEGATIVE
Ketones, ur: NEGATIVE mg/dL
Leukocytes,Ua: NEGATIVE
Nitrite: NEGATIVE
Protein, ur: NEGATIVE mg/dL
Specific Gravity, Urine: 1.014 (ref 1.005–1.030)
pH: 8 (ref 5.0–8.0)

## 2021-10-17 LAB — AMMONIA: Ammonia: 73 umol/L — ABNORMAL HIGH (ref 9–35)

## 2021-10-17 LAB — LIPASE, BLOOD: Lipase: 26 U/L (ref 11–51)

## 2021-10-17 LAB — ACETAMINOPHEN LEVEL: Acetaminophen (Tylenol), Serum: <10 ug/mL — ABNORMAL LOW (ref 10–30)

## 2021-10-17 MED ORDER — PANTOPRAZOLE SODIUM 40 MG PO TBEC
40.0000 mg | DELAYED_RELEASE_TABLET | Freq: Two times a day (BID) | ORAL | Status: DC
  Administered 2021-10-17 – 2021-10-18 (×2): 40 mg via ORAL
  Filled 2021-10-17 (×2): qty 1

## 2021-10-17 MED ORDER — ONDANSETRON HCL 4 MG/2ML IJ SOLN
4.0000 mg | Freq: Once | INTRAMUSCULAR | Status: AC
Start: 2021-10-17 — End: 2021-10-17
  Administered 2021-10-17: 4 mg via INTRAVENOUS
  Filled 2021-10-17: qty 2

## 2021-10-17 MED ORDER — LACTATED RINGERS IV BOLUS
1000.0000 mL | Freq: Once | INTRAVENOUS | Status: AC
Start: 2021-10-17 — End: 2021-10-17
  Administered 2021-10-17: 1000 mL via INTRAVENOUS

## 2021-10-17 MED ORDER — ALBUTEROL SULFATE (2.5 MG/3ML) 0.083% IN NEBU: 2.5000 mg | INHALATION_SOLUTION | RESPIRATORY_TRACT | Status: DC | PRN

## 2021-10-17 MED ORDER — LORAZEPAM 1 MG PO TABS: 1.0000 mg | ORAL_TABLET | ORAL | Status: DC | PRN

## 2021-10-17 MED ORDER — ONDANSETRON HCL 4 MG/2ML IJ SOLN: 4.0000 mg | Freq: Four times a day (QID) | INTRAMUSCULAR | Status: DC | PRN

## 2021-10-17 MED ORDER — ADULT MULTIVITAMIN W/MINERALS CH
1.0000 | ORAL_TABLET | Freq: Every day | ORAL | Status: DC
  Administered 2021-10-18: 1 via ORAL
  Filled 2021-10-17 (×2): qty 1

## 2021-10-17 MED ORDER — ONDANSETRON HCL 4 MG PO TABS: 4.0000 mg | ORAL_TABLET | Freq: Four times a day (QID) | ORAL | Status: DC | PRN

## 2021-10-17 MED ORDER — THIAMINE HCL 100 MG PO TABS
100.0000 mg | ORAL_TABLET | Freq: Every day | ORAL | Status: DC
  Administered 2021-10-18: 100 mg via ORAL
  Filled 2021-10-17 (×2): qty 1

## 2021-10-17 MED ORDER — THIAMINE HCL 100 MG/ML IJ SOLN: 100.0000 mg | Freq: Every day | INTRAMUSCULAR | Status: DC

## 2021-10-17 MED ORDER — FOLIC ACID 1 MG PO TABS
1.0000 mg | ORAL_TABLET | Freq: Every day | ORAL | Status: DC
  Administered 2021-10-17 – 2021-10-18 (×2): 1 mg via ORAL
  Filled 2021-10-17 (×2): qty 1

## 2021-10-17 MED ORDER — ACETAMINOPHEN 650 MG RE SUPP: 650.0000 mg | Freq: Four times a day (QID) | RECTAL | Status: DC | PRN

## 2021-10-17 MED ORDER — LORAZEPAM 2 MG/ML IJ SOLN: 1.0000 mg | INTRAMUSCULAR | Status: DC | PRN

## 2021-10-17 MED ORDER — IOHEXOL 300 MG/ML  SOLN
100.0000 mL | Freq: Once | INTRAMUSCULAR | Status: AC | PRN
  Administered 2021-10-17: 100 mL via INTRAVENOUS

## 2021-10-17 MED ORDER — TRAZODONE HCL 50 MG PO TABS
50.0000 mg | ORAL_TABLET | Freq: Every evening | ORAL | Status: DC | PRN
  Administered 2021-10-17: 50 mg via ORAL
  Filled 2021-10-17: qty 1

## 2021-10-17 MED ORDER — LORAZEPAM 2 MG/ML IJ SOLN
0.0000 mg | Freq: Four times a day (QID) | INTRAMUSCULAR | Status: DC
  Administered 2021-10-18: 1 mg via INTRAVENOUS
  Filled 2021-10-17: qty 1

## 2021-10-17 MED ORDER — MORPHINE SULFATE (PF) 2 MG/ML IV SOLN
2.0000 mg | INTRAVENOUS | Status: DC | PRN
  Administered 2021-10-18 (×4): 2 mg via INTRAVENOUS
  Filled 2021-10-17 (×4): qty 1

## 2021-10-17 MED ORDER — NICOTINE 7 MG/24HR TD PT24
7.0000 mg | MEDICATED_PATCH | Freq: Every day | TRANSDERMAL | Status: DC
  Administered 2021-10-17 – 2021-10-18 (×2): 7 mg via TRANSDERMAL
  Filled 2021-10-17 (×2): qty 1

## 2021-10-17 MED ORDER — OXYCODONE HCL 5 MG PO TABS
5.0000 mg | ORAL_TABLET | ORAL | Status: DC | PRN
  Administered 2021-10-17: 5 mg via ORAL
  Filled 2021-10-17: qty 1

## 2021-10-17 MED ORDER — HEPARIN SODIUM (PORCINE) 5000 UNIT/ML IJ SOLN
5000.0000 [IU] | Freq: Three times a day (TID) | INTRAMUSCULAR | Status: DC
  Administered 2021-10-17 – 2021-10-18 (×3): 5000 [IU] via SUBCUTANEOUS
  Filled 2021-10-17 (×3): qty 1

## 2021-10-17 MED ORDER — ACETAMINOPHEN 325 MG PO TABS: 650.0000 mg | ORAL_TABLET | Freq: Four times a day (QID) | ORAL | Status: DC | PRN

## 2021-10-17 MED ORDER — MORPHINE SULFATE (PF) 4 MG/ML IV SOLN
4.0000 mg | INTRAVENOUS | Status: DC | PRN
  Administered 2021-10-17: 4 mg via INTRAVENOUS
  Filled 2021-10-17: qty 1

## 2021-10-17 MED ORDER — SPIRONOLACTONE 25 MG PO TABS
25.0000 mg | ORAL_TABLET | Freq: Every day | ORAL | Status: DC
Start: 2021-10-17 — End: 2021-10-18
  Administered 2021-10-17 – 2021-10-18 (×2): 25 mg via ORAL
  Filled 2021-10-17 (×2): qty 1

## 2021-10-17 MED ORDER — LORAZEPAM 2 MG/ML IJ SOLN: 0.0000 mg | Freq: Two times a day (BID) | INTRAMUSCULAR | Status: DC

## 2021-10-17 MED ORDER — MORPHINE SULFATE (PF) 4 MG/ML IV SOLN
4.0000 mg | Freq: Once | INTRAVENOUS | Status: AC
  Administered 2021-10-17: 4 mg via INTRAVENOUS
  Filled 2021-10-17: qty 1

## 2021-10-17 MED ORDER — ALBUTEROL SULFATE HFA 108 (90 BASE) MCG/ACT IN AERS
2.0000 | INHALATION_SPRAY | RESPIRATORY_TRACT | Status: DC | PRN
Start: 2021-10-17 — End: 2021-10-17

## 2021-10-17 NOTE — Assessment & Plan Note (Signed)
-  Cessation counseling provided -Continue nicotine patch. 

## 2021-10-17 NOTE — Assessment & Plan Note (Addendum)
-  Continue Protonix -Lifestyle changes discussed with patient. 

## 2021-10-17 NOTE — Assessment & Plan Note (Signed)
6-10 beers daily -No withdrawal symptoms appreciated -Patient has been discharged on Librium -Cessation counseling provided -Outpatient resources given by TOC.

## 2021-10-17 NOTE — Assessment & Plan Note (Signed)
-   Presents with abdominal pain especially over the right upper quadrant. -Pain appears to be secondary to Gleason capsule distention in the setting of alcohol hepatitis. -LFTs and improvement with supportive care. -Absolute abstinence has been recommended -Outpatient follow-up with gastroenterology service -Patient has been discharged on Librium to assist with alcohol cessation. -Continue PPI -Continue spironolactone. -Patient advised to follow low-sodium diet and to continue adequate hydration

## 2021-10-17 NOTE — H&P (Signed)
History and Physical    Patient: Jake King NOT:[id number] DOB: Jun 15, 1990 DOA: 10/17/2021 DOS: the patient was seen and examined on 10/17/2021 PCP: Lindell Spar, MD  Patient coming from: Home  Chief Complaint:  Chief Complaint  Patient presents with   Abdominal Pain   HPI: Jake King is a 31 y.o. male with medical history significant of hepatitis, hypertension, seizures, vitamin D deficiency, and more presents the ED with a chief complaint of abdominal pain.  Patient reports he has abdominal pain that is in his flanks, radiates to his lower back on his left side, is located laterally in the pelvis, but not midline.  The greatest point of the pain is right upper quadrant under his ribs.  He reports the pain started 2 weeks ago.  Patient has been drinking and that makes it worse.  He has not tried anything like ibuprofen or Tylenol.  He has nausea and vomiting.  He has not been able to keep anything down over the last 5 days.  Every time he eats he vomits.  He had a decreased appetite as well.  He has no hematemesis.  His last bowel movement was yesterday.  It was abnormal and that it was all liquid and yellow.  Patient reports that he has had liquid bowel movements for the last 10 days.  If he does eat something he reports having melena.  Hemoglobin is stable at 14.3.  Patient has no bright red blood per rectum.  Patient has no chest pain, no dyspnea.  He describes his back pain is a throbbing sharp pain.  His belly pain is also described as sharp.  The back pain is worse when he moves around.  For example it was much worse when he ambulated to the bathroom.  Patient denies any dysuria, hematuria.  Patient has no other complaints at this time.  Patient is currently smoking and working on quitting.  He is down to a quarter of a pack a day from half a pack a day.  He is drinking alcohol.  He reports that he was able to completely quit after his last hospitalization, but then had a  beer that led him right back to heavy drinking.  He drinks 6-10 beers per day.  No other forms of alcohol.  He has no illicit drug use.  He is not vaccinated for COVID.  Patient is full code. Review of Systems: As mentioned in the history of present illness. All other systems reviewed and are negative. Past Medical History:  Diagnosis Date   Brain mass    pineal cyst   Hepatitis    Hypertension    Migraine    Right knee injury    Seizures ([place])    Vitamin D deficiency    Past Surgical History:  Procedure Laterality Date   BIOPSY  06/10/2021   Procedure: BIOPSY;  Surgeon: Eloise Harman, DO;  [facility];  Service: Endoscopy;;   ESOPHAGOGASTRODUODENOSCOPY (EGD) WITH PROPOFOL N/A 06/10/2021   Procedure: ESOPHAGOGASTRODUODENOSCOPY (EGD) WITH PROPOFOL;  Surgeon: Eloise Harman, DO;  [facility];  Service: Endoscopy;  Laterality: N/A;  11:15am, ASA 2   NASAL SINUS SURGERY     NASAL SINUS SURGERY  2009   Social History:  reports that he has been smoking cigarettes. He has been smoking an average of .5 packs per day. He has quit using smokeless tobacco. He reports that he does not currently use alcohol after a past usage of  about 2.0 - 3.0 standard drinks of alcohol per week. He reports that he does not currently use drugs.  Allergies  Allergen Reactions   Atropine Swelling    Eye Drops from when he was a baby   Other Swelling    SULFITES IN ALCOHOLS     Family History  Problem Relation Age of Onset   Other Mother        "Twisted Colon"   Mental illness Mother    Diabetes Father    Hypertension Father    Pneumonia Father    Liver disease Father        unsure what   Ulcerative colitis Paternal Grandmother    Colon polyps Paternal Grandmother    Congestive Heart Failure Paternal Grandfather    Hypertension Paternal Grandfather    Diabetes Paternal Grandfather    Multiple sclerosis Paternal Grandfather    Colon cancer Neg Hx     Prior to Admission  medications   Medication Sig Start Date End Date Taking? Authorizing Provider  albuterol (VENTOLIN HFA) 108 (90 Base) MCG/ACT inhaler Inhale 2 puffs into the lungs every 4 (four) hours as needed for wheezing or shortness of breath. 05/01/21  Yes Lindell Spar, MD  Cholecalciferol (VITAMIN D) 50 MCG (2000 UT) CAPS Take 2,000 Units by mouth in the morning.   Yes [provider]  folic acid (FOLVITE) 1 MG tablet Take 1 tablet (1 mg total) by mouth daily. 07/13/21  Yes Barton Dubois, MD  Multiple Vitamins-Minerals (MULTIVITAMIN WITH MINERALS) tablet Take 1 tablet by mouth in the morning.   Yes [provider]  pantoprazole (PROTONIX) 40 MG tablet Take 1 tablet (40 mg total) by mouth 2 (two) times daily. Take 30 minutes before breakfast 06/12/21 12/09/21 Yes Carver, Charles K, DO  traZODone (DESYREL) 50 MG tablet Take 1 tablet (50 mg total) by mouth at bedtime as needed for sleep. 07/15/21  Yes Alvira Monday, FNP  cycloSPORINE (RESTASIS) 0.05 % ophthalmic emulsion Place 1 drop into both eyes 2 (two) times daily.    [provider]  naltrexone (DEPADE) 50 MG tablet Take 1 tablet (50 mg total) by mouth daily. Patient not taking: Reported on 10/17/2021 09/05/21   Lindell Spar, MD    Physical Exam: Vitals:   10/17/21 1733 10/17/21 1734 10/17/21 2025  BP: 134/86  (!) 133/95  Pulse: 65  68  Resp: 20  20  Temp: 98.8 F (37.1 C)  97.8 F (36.6 C)  TempSrc: Oral  Oral  SpO2: 98%  98%  Weight:  84.4 kg 86.1 kg  Height:  _0  (1.778 m) _1  (1.778 m)   1.  General: Patient lying supine in bed,  no acute distress   2. Psychiatric: Alert and oriented x 3, mood and behavior normal for situation, pleasant and cooperative with exam   3. Neurologic: Speech and language are normal, face is symmetric, moves all 4 extremities voluntarily, at baseline without acute deficits on limited exam   4. HEENMT:  Head is atraumatic, normocephalic, pupils reactive to light, neck is  supple, trachea is midline, mucous membranes are moist   5. Respiratory : Lungs are clear to auscultation bilaterally without wheezing, rhonchi, rales, no cyanosis, no increase in work of breathing or accessory muscle use   6. Cardiovascular : Heart rate normal, rhythm is regular, no murmurs, rubs or gallops, no peripheral edema, peripheral pulses palpated   7. Gastrointestinal:  Abdomen is diffusely tender, no tenderness over suprapubic region, most exquisite  tenderness is in the right upper quadrant under his ribs, and then the left lower quadrant would be next, bowel sounds active, very mild distention, no guarding,   8. Skin:  Skin is warm, dry and intact without rashes, acute lesions, or ulcers on limited exam   9.Musculoskeletal:  No acute deformities or trauma, no asymmetry in tone, no peripheral edema, peripheral pulses palpated, no tenderness to palpation in the extremities  Data Reviewed: In the ED Temp 98.8, heart rate 65, respiratory rate 20, blood pressure 134/86, satting at 98% No leukocytosis with white blood cell count of 5.1, hemoglobin 14.3, platelets 115 Chemistry shows an AST of 209, ALT of 75, lipase of 26, T. bili up to 6.1, albumin of 2.6 and alk phos 202 Tylenol levels undetectable CT abdomen pelvis shows portal venous hypertension, trace ascites, gastric varices Admission was requested for alcoholic hepatitis and detox  Assessment and Plan: * Alcoholic hepatitis - Presents with abdominal pain especially over the right upper quadrant -Transaminitis with an AST 209, ALT 75, really not much different than it was in June -Recently drinking 6-10 beers per day -Pain scale for pain control -Push p.o. fluids -CT scan shows portal venous hypertension, start spironolactone -Continue to monitor  Alcohol abuse - 6-10 beers daily -Attempts to quit result in tremulousness and hallucinations -History of seizures unrelated to alcohol per patient -Started on CIWA  protocol -Patient wishes to detox in hospital  Protein-calorie malnutrition, moderate ([place]) - Albumin down to 2.6 -Likely due to poor p.o. intake in the setting of alcoholism -Encourage nutrient dense food choices -Continue to monitor  GERD (gastroesophageal reflux disease) - Continue Protonix  Tobacco abuse - Continue nicotine patch      Advance Care Planning:   Code Status: Full Code   Consults: None  Family Communication: No family at bedside  Severity of Illness: The appropriate patient status for this patient is OBSERVATION. Observation status is judged to be reasonable and necessary in order to provide the required intensity of service to ensure the patient's safety. The patient's presenting symptoms, physical exam findings, and initial radiographic and laboratory data in the context of their medical condition is felt to place them at decreased risk for further clinical deterioration. Furthermore, it is anticipated that the patient will be medically stable for discharge from the hospital within 2 midnights of admission.   Author: Rolla Plate, DO 10/17/2021 8:39 PM  For on call review [email or web address].

## 2021-10-17 NOTE — ED Triage Notes (Signed)
Pt presents with increased abdominal pain x 2 weeks, PMH of alcoholic hepatitis.

## 2021-10-17 NOTE — ED Provider Notes (Signed)
[facility] EMERGENCY DEPARTMENT Provider Note   CSN: [id number] Arrival date & time: 10/17/21  1705     History  Chief Complaint  Patient presents with   Abdominal Pain    Jake King is a 31 y.o. male with history of alcoholic hepatitis and alcoholic pancreatitis who presents to the emergency department for evaluation of increased abdominal pain that started about 2 weeks ago and has significantly worsened.  Patient states over the last few days, he has been regurgitating food and fluid.  No prodrome of nausea.  He notes pain is quite severe in the epigastric and right upper quadrant, however he is also having severe pain down into the lower pelvis region as well.  He states that he thinks he is dehydrated as the few times that he is urinated has been dark brown.  He denies chest pain, shortness of breath, diarrhea.  Patient was admitted July 11, 5782 for alcoholic pancreatitis with alcoholic hepatitis.  He states that he stopped drinking shortly after that, however at the end of June, he went to a few cookouts and started having a few drinks and has now fully relapsed.  He states he is drinking about 6-8 beers a day.  He has a follow-up appoint with gastroenterology this coming Wednesday on 10/22/2021.  He has not seen him since he was discharged at the end of February.  Abdominal Pain Associated symptoms: vomiting   Associated symptoms: no chest pain, no cough, no fever, no nausea and no shortness of breath        Home Medications Prior to Admission medications   Medication Sig Start Date End Date Taking? Authorizing Provider  albuterol (VENTOLIN HFA) 108 (90 Base) MCG/ACT inhaler Inhale 2 puffs into the lungs every 4 (four) hours as needed for wheezing or shortness of breath. 05/01/21  Yes Lindell Spar, MD  Cholecalciferol (VITAMIN D) 50 MCG (2000 UT) CAPS Take 2,000 Units by mouth in the morning.   Yes [provider]  folic acid (FOLVITE) 1 MG tablet Take 1  tablet (1 mg total) by mouth daily. 07/13/21  Yes Barton Dubois, MD  Multiple Vitamins-Minerals (MULTIVITAMIN WITH MINERALS) tablet Take 1 tablet by mouth in the morning.   Yes [provider]  pantoprazole (PROTONIX) 40 MG tablet Take 1 tablet (40 mg total) by mouth 2 (two) times daily. Take 30 minutes before breakfast 06/12/21 12/09/21 Yes Carver, Charles K, DO  traZODone (DESYREL) 50 MG tablet Take 1 tablet (50 mg total) by mouth at bedtime as needed for sleep. 07/15/21  Yes Alvira Monday, FNP  cycloSPORINE (RESTASIS) 0.05 % ophthalmic emulsion Place 1 drop into both eyes 2 (two) times daily.    [provider]  naltrexone (DEPADE) 50 MG tablet Take 1 tablet (50 mg total) by mouth daily. Patient not taking: Reported on 10/17/2021 09/05/21   Lindell Spar, MD      Allergies    Atropine and Other    Review of Systems   Review of Systems  Constitutional:  Negative for fever.  Respiratory:  Negative for cough and shortness of breath.   Cardiovascular:  Negative for chest pain.  Gastrointestinal:  Positive for abdominal pain and vomiting. Negative for nausea.  Genitourinary:        Dark urine  Musculoskeletal:  Positive for back pain.    Physical Exam Updated Vital Signs BP 134/86   Pulse 65   Temp 98.8 F (37.1 C) (Oral)   Resp 20   Ht  5' 10"  (1.778 m)   Wt 84.4 kg   SpO2 98%   BMI 26.69 kg/m  Physical Exam Vitals and nursing note reviewed.  Constitutional:      General: He is not in acute distress.    Appearance: He is ill-appearing.     Comments: uncomfortable  HENT:     Head: Atraumatic.  Eyes:     General: Scleral icterus present.     Conjunctiva/sclera: Conjunctivae normal.  Cardiovascular:     Rate and Rhythm: Normal rate and regular rhythm.     Pulses: Normal pulses.     Heart sounds: No murmur heard. Pulmonary:     Effort: Pulmonary effort is normal. No respiratory distress.     Breath sounds: Normal breath sounds.  Abdominal:     General:  Abdomen is flat. There is no distension.     Palpations: Abdomen is soft.     Tenderness: There is abdominal tenderness. There is no right CVA tenderness or left CVA tenderness. Negative signs include McBurney's sign.       Comments: Exquisitely TTP epigastrically and to RUQ  Also has severe tenderness at right and left pelvic region  Musculoskeletal:        General: Normal range of motion.     Cervical back: Normal range of motion.  Skin:    General: Skin is warm and dry.     Capillary Refill: Capillary refill takes less than 2 seconds.  Neurological:     General: No focal deficit present.     Mental Status: He is alert.  Psychiatric:        Mood and Affect: Mood normal.     ED Results / Procedures / Treatments   Labs (all labs ordered are listed, but only abnormal results are displayed) Labs Reviewed  COMPREHENSIVE METABOLIC PANEL - Abnormal; Notable for the following components:      Result Value   BUN <5 (*)    Creatinine, Ser 0.60 (*)    Calcium 8.0 (*)    Albumin 2.6 (*)    AST 209 (*)    ALT 75 (*)    Alkaline Phosphatase 202 (*)    Total Bilirubin 6.1 (*)    All other components within normal limits  CBC - Abnormal; Notable for the following components:   RDW 16.2 (*)    Platelets 115 (*)    All other components within normal limits  LIPASE, BLOOD  URINALYSIS, ROUTINE W REFLEX MICROSCOPIC  ACETAMINOPHEN LEVEL  HEPATITIS PANEL, ACUTE    EKG None  Radiology No results found.  Procedures Procedures    Medications Ordered in ED Medications  lactated ringers bolus 1,000 mL (1,000 mLs Intravenous New Bag/Given 10/17/21 1812)  morphine (PF) 4 MG/ML injection 4 mg (4 mg Intravenous Given 10/17/21 1813)  ondansetron (ZOFRAN) injection 4 mg (4 mg Intravenous Given 10/17/21 1813)    ED Course/ Medical Decision Making/ A&P                           Medical Decision Making Amount and/or Complexity of Data Reviewed Labs: ordered. Radiology:  ordered.  Risk Prescription drug management.   Social determinants of health:  Social History   Socioeconomic History   Marital status: Significant Other    Spouse name: Caryl Pina   Number of children: 1   Years of education: GED   Highest education level: Not on file  Occupational History   Occupation: [employer]  Tobacco  Use   Smoking status: Every Day    Packs/day: 0.50    Types: Cigarettes   Smokeless tobacco: Former  Substance and Sexual Activity   Alcohol use: Not Currently    Alcohol/week: 2.0 - 3.0 standard drinks of alcohol    Types: 2 - 3 Cans of beer per week    Comment: 3-4 beers every night    Drug use: Not Currently    Comment: no drugs since 2018   Sexual activity: Not on file  Other Topics Concern   Not on file  Social History Narrative   Lives with Lillia Corporal Caryl Pina) and their kids   Caffeine use: none   Right handed    Social Determinants of Health   Financial Resource Strain: Not on file  Food Insecurity: Not on file  Transportation Needs: Not on file  Physical Activity: Not on file  Stress: Not on file  Social Connections: Not on file  Intimate Partner Violence: Not on file     Initial impression:  This patient presents to the ED for concern of severe abdominal pain with vomiting in the setting of alcoholic hepatitis and previous history of alcoholic pancreatitis, this involves an extensive number of treatment options, and is a complaint that carries with it a high risk of complications and morbidity.   Differentials include pancreatitis, gastritis, PUD, rhabdomyolysis, UTI, nephrolithiasis, cholelithiasis, cholecystitis.   Comorbidities affecting care:  Alcoholic hepatitis  Additional history obtained: Discharge summary from hospitalization at the end of April 2023  Lab Tests  I Ordered, reviewed, and interpreted labs and EKG.  The pertinent results include:  No leukocytosis Lipase normal CMP with elevated LFTs consistent with  previous Pending UA  Imaging Studies ordered:  I ordered imaging studies including  Pending CT abdomen and pelvis I independently visualized and interpreted imaging and I agree with the radiologist interpretation.   Cardiac Monitoring:  The patient was maintained on a cardiac monitor.  I personally viewed and interpreted the cardiac monitored which showed an underlying rhythm of: Sinus rhythm   Medicines ordered and prescription drug management:  I ordered medication including: 1 L LR bolus Morphine 4 mg Reevaluation of the patient after these medicines showed that the patient improved I have reviewed the patients home medicines and have made adjustments as needed   ED Course/Re-evaluation: Pt is ill-appearing and uncomfortable although nontoxic. Vitals are without significant abnormality. On exam, abdomen is nondistended but exquisitely tender to palpation epigastrically and in the RUQ. Also has severe tenderness on the right and left pelvic regions. Noted scleral icterus.  I suspect that he has recurrence of pancreatitis given clinical exam, so I ordered fluids with zofran and morphine along with abdominal labs and CT abdomen and pelvis.  AST, ALT and alk phos are all elevated, similar to previously noted.  His lipase was actually normal.  No leukocytosis. Patient care handed off to [facility] at shift change who will monitor CT abdomen results and urinalysis and anticipate admission.    Final Clinical Impression(s) / ED Diagnoses Final diagnoses:  Epigastric pain    Rx / DC Orders ED Discharge Orders     None         Rodena Piety 10/17/21 1900    Noemi Chapel, MD 10/18/21 1119

## 2021-10-17 NOTE — Assessment & Plan Note (Signed)
-   Albumin down to 2.6 -Likely due to poor p.o. intake in the setting of alcoholism -Encourage nutrient dense food choices -Continue to monitor

## 2021-10-17 NOTE — ED Notes (Signed)
Patient transported to CT 

## 2021-10-17 NOTE — ED Provider Notes (Signed)
30yo male with epigastric and RUQ pain. History of pancreatitis and alcoholic hepatitis. Lipase WNL, LFTs elevated (similar to prior). Pending CT A/P.  Physical Exam  BP 134/86   Pulse 65   Temp 98.8 F (37.1 C) (Oral)   Resp 20   Ht 5\' 10"  (1.778 m)   Wt 84.4 kg   SpO2 98%   BMI 26.69 kg/m   Physical Exam  Procedures  Procedures  ED Course / MDM    Medical Decision Making Amount and/or Complexity of Data Reviewed Labs: ordered. Radiology: ordered.  Risk Prescription drug management. Decision regarding hospitalization.   Pain improved after 1 dose of morphine.  Patient is ambulatory with steady gait to the bathroom.  CT with:  IMPRESSION:  1. Cirrhosis, with evidence of portal venous hypertension manifested  by trace ascites, splenomegaly, and gastric varices.  2. Hepatomegaly, with diffuse hepatic steatosis unchanged.  3. Mild colonic wall thickening, nonspecific given presence of  ascites and decompressed state of the colon. Underlying inflammatory  or infectious colitis cannot be excluded.  4. Stable borderline size lymphadenopathy at the porta hepatis.   Discussed results with patient.  Patient is agreeable with plan for admission to the hospital.  Case discussed with Dr. with [employer] hospitalist service who will consult for admission.      Camillo Flaming, PA-C 10/17/21 2009    12/17/21, MD 10/18/21 1120

## 2021-10-18 DIAGNOSIS — K7010 Alcoholic hepatitis without ascites: Secondary | ICD-10-CM | POA: Diagnosis not present

## 2021-10-18 DIAGNOSIS — K766 Portal hypertension: Secondary | ICD-10-CM | POA: Diagnosis not present

## 2021-10-18 DIAGNOSIS — F1010 Alcohol abuse, uncomplicated: Secondary | ICD-10-CM | POA: Diagnosis not present

## 2021-10-18 DIAGNOSIS — K219 Gastro-esophageal reflux disease without esophagitis: Secondary | ICD-10-CM | POA: Diagnosis not present

## 2021-10-18 LAB — CBC WITH DIFFERENTIAL/PLATELET
Abs Immature Granulocytes: 0.01 10*3/uL (ref 0.00–0.07)
Basophils Absolute: 0 10*3/uL (ref 0.0–0.1)
Basophils Relative: 1 %
Eosinophils Absolute: 0.2 10*3/uL (ref 0.0–0.5)
Eosinophils Relative: 4 %
HCT: 41.5 % (ref 39.0–52.0)
Hemoglobin: 13.8 g/dL (ref 13.0–17.0)
Immature Granulocytes: 0 %
Lymphocytes Relative: 32 %
Lymphs Abs: 1.5 10*3/uL (ref 0.7–4.0)
MCH: 30 pg (ref 26.0–34.0)
MCHC: 33.3 g/dL (ref 30.0–36.0)
MCV: 90.2 fL (ref 80.0–100.0)
Monocytes Absolute: 0.5 10*3/uL (ref 0.1–1.0)
Monocytes Relative: 12 %
Neutro Abs: 2.3 10*3/uL (ref 1.7–7.7)
Neutrophils Relative %: 51 %
Platelets: 99 10*3/uL — ABNORMAL LOW (ref 150–400)
RBC: 4.6 MIL/uL (ref 4.22–5.81)
RDW: 16.6 % — ABNORMAL HIGH (ref 11.5–15.5)
WBC: 4.5 10*3/uL (ref 4.0–10.5)
nRBC: 0 % (ref 0.0–0.2)

## 2021-10-18 LAB — COMPREHENSIVE METABOLIC PANEL
ALT: 63 U/L — ABNORMAL HIGH (ref 0–44)
AST: 170 U/L — ABNORMAL HIGH (ref 15–41)
Albumin: 2.3 g/dL — ABNORMAL LOW (ref 3.5–5.0)
Alkaline Phosphatase: 172 U/L — ABNORMAL HIGH (ref 38–126)
Anion gap: 7 (ref 5–15)
BUN: <5 mg/dL — ABNORMAL LOW (ref 6–20)
CO2: 27 mmol/L (ref 22–32)
Calcium: 8.2 mg/dL — ABNORMAL LOW (ref 8.9–10.3)
Chloride: 103 mmol/L (ref 98–111)
Creatinine, Ser: 0.58 mg/dL — ABNORMAL LOW (ref 0.61–1.24)
GFR, Estimated: >60 mL/min (ref >60)
Glucose, Bld: 87 mg/dL (ref 70–99)
Potassium: 4.2 mmol/L (ref 3.5–5.1)
Sodium: 137 mmol/L (ref 135–145)
Total Bilirubin: 7.3 mg/dL — ABNORMAL HIGH (ref 0.3–1.2)
Total Protein: 6 g/dL — ABNORMAL LOW (ref 6.5–8.1)

## 2021-10-18 LAB — HEPATITIS PANEL, ACUTE
HCV Ab: NONREACTIVE
Hep A IgM: NONREACTIVE
Hep B C IgM: NONREACTIVE
Hepatitis B Surface Ag: NONREACTIVE

## 2021-10-18 LAB — MAGNESIUM: Magnesium: 1.4 mg/dL — ABNORMAL LOW (ref 1.7–2.4)

## 2021-10-18 LAB — TSH: TSH: 4.266 u[IU]/mL (ref 0.350–4.500)

## 2021-10-18 MED ORDER — CHLORDIAZEPOXIDE HCL 5 MG PO CAPS: ORAL_CAPSULE | ORAL | 0 refills | Status: DC

## 2021-10-18 MED ORDER — NICOTINE 14 MG/24HR TD PT24: 14.0000 mg | MEDICATED_PATCH | Freq: Every day | TRANSDERMAL | 1 refills | Status: DC

## 2021-10-18 MED ORDER — SPIRONOLACTONE 25 MG PO TABS: 25.0000 mg | ORAL_TABLET | Freq: Every day | ORAL | 1 refills | Status: DC

## 2021-10-18 MED ORDER — THIAMINE HCL 100 MG PO TABS: 100.0000 mg | ORAL_TABLET | Freq: Every day | ORAL | 1 refills | Status: DC

## 2021-10-18 NOTE — TOC Progression Note (Signed)
Transition of Care [facility]) - Progression Note    Patient Details  Name: Jake King MRN: [id number] Date of Birth: [birth year]  Transition of Care [facility]) CM/SW Contact  Karn Cassis, [place] Phone Number: 10/18/2021, 9:10 AM  Clinical Narrative:  [facility] consulted for substance use. Pt admits to drinking 6-10 beers daily. He states he stopped drinking in April and then had a few drinks at a cookout and started drinking heavily again. Pt indicates he was feeling much better when he was sober and feels alcohol has become a problem for him again, especially impacting his health. Pt is prescribed Naltrexone. Discussed substance use treatment options and pt accepted treatment list in case needed. TOC will continue to follow.           Expected Discharge Plan and Services                                                 Social Determinants of Health (SDOH) Interventions    Readmission Risk Interventions     No data to display

## 2021-10-18 NOTE — Discharge Summary (Signed)
Physician Discharge Summary   Patient: Jake King MRN: [id number] DOB: Dec 10, 1990  Admit date:     10/17/2021  Discharge date: 10/18/21  Discharge Physician: Vassie Loll   PCP: Anabel Halon, MD   Recommendations at discharge:  Repeat complete metabolic panel to follow electrolytes, renal function and LFTs. Continue assisting patient with alcohol and tobacco cessation. Follow-up with gastroenterology service recommended.  Discharge Diagnoses: Principal Problem:   Alcoholic hepatitis Active Problems:   Tobacco abuse   GERD (gastroesophageal reflux disease)   Protein-calorie malnutrition, moderate (HCC)   Alcohol abuse   Portal venous hypertension (HCC)    Brief Hospital admission Course: As per H&P written by Dr. Carren Rang on 10/17/21 Jake King is a 31 y.o. male with medical history significant of hepatitis, hypertension, seizures, vitamin D deficiency, and more presents the ED with a chief complaint of abdominal pain.  Patient reports he has abdominal pain that is in his flanks, radiates to his lower back on his left side, is located laterally in the pelvis, but not midline.  The greatest point of the pain is right upper quadrant under his ribs.  He reports the pain started 2 weeks ago.  Patient has been drinking and that makes it worse.  He has not tried anything like ibuprofen or Tylenol.  He has nausea and vomiting.  He has not been able to keep anything down over the last 5 days.  Every time he eats he vomits.  He had a decreased appetite as well.  He has no hematemesis.  His last bowel movement was yesterday.  It was abnormal and that it was all liquid and yellow.  Patient reports that he has had liquid bowel movements for the last 10 days.  If he does eat something he reports having melena.  Hemoglobin is stable at 14.3.  Patient has no bright red blood per rectum.  Patient has no chest pain, no dyspnea.  He describes his back pain is a throbbing sharp  pain.  His belly pain is also described as sharp.  The back pain is worse when he moves around.  For example it was much worse when he ambulated to the bathroom.  Patient denies any dysuria, hematuria.  Patient has no other complaints at this time.   Patient is currently smoking and working on quitting.  He is down to a quarter of a pack a day from half a pack a day.  He is drinking alcohol.  He reports that he was able to completely quit after his last hospitalization, but then had a beer that led him right back to heavy drinking.  He drinks 6-10 beers per day.  No other forms of alcohol.  He has no illicit drug use.  He is not vaccinated for COVID.  Patient is full code.  Assessment and Plan: * Alcoholic hepatitis - Presents with abdominal pain especially over the right upper quadrant. -Pain appears to be secondary to Gleason capsule distention in the setting of alcohol hepatitis. -LFTs and improvement with supportive care. -Absolute abstinence has been recommended -Outpatient follow-up with gastroenterology service -Patient has been discharged on Librium to assist with alcohol cessation. -Continue PPI -Continue spironolactone. -Patient advised to follow low-sodium diet and to continue adequate hydration  Portal venous hypertension (HCC) - Patient has been discharged on aspirin lactone -Based on fluctuation in his vital signs low-dose beta-blocker recommended -Follow closely clinical response.  Alcohol abuse 6-10 beers daily -No withdrawal symptoms appreciated -Patient has been discharged on  Librium -Cessation counseling provided -Outpatient resources given by TOC.   GERD (gastroesophageal reflux disease) -Continue Protonix -Lifestyle changes discussed with patient.  Tobacco abuse -Cessation counseling provided -Continue nicotine patch   Consultants: None Procedures performed: See below for x-ray reports. Disposition: Home Diet recommendation: Heart healthy low-sodium  diet.   DISCHARGE MEDICATION: Allergies as of 10/18/2021       Reactions   Atropine Swelling   Eye Drops from when he was a baby   Other Swelling   SULFITES IN ALCOHOLS         Medication List     STOP taking these medications    naltrexone 50 MG tablet Commonly known as: DEPADE       TAKE these medications    albuterol 108 (90 Base) MCG/ACT inhaler Commonly known as: VENTOLIN HFA Inhale 2 puffs into the lungs every 4 (four) hours as needed for wheezing or shortness of breath.   chlordiazePOXIDE 5 MG capsule Commonly known as: LIBRIUM Take 3 tablets by mouth twice a day x1 day; then 2 tablets by mouth twice a day x2 days; then 1 tablet by mouth 3 times a day x1 day; then 1 tablet by mouth twice a day x2 days; then 1 tablet by mouth daily x3 days and stop Librium.   cycloSPORINE 0.05 % ophthalmic emulsion Commonly known as: RESTASIS Place 1 drop into both eyes 2 (two) times daily.   folic acid 1 MG tablet Commonly known as: FOLVITE Take 1 tablet (1 mg total) by mouth daily.   multivitamin with minerals tablet Take 1 tablet by mouth in the morning.   nicotine 14 mg/24hr patch Commonly known as: NICODERM CQ - dosed in mg/24 hours Place 1 patch (14 mg total) onto the skin daily. Start taking on: October 19, 2021   pantoprazole 40 MG tablet Commonly known as: PROTONIX Take 1 tablet (40 mg total) by mouth 2 (two) times daily. Take 30 minutes before breakfast   spironolactone 25 MG tablet Commonly known as: ALDACTONE Take 1 tablet (25 mg total) by mouth daily. Start taking on: October 19, 2021   thiamine 100 MG tablet Commonly known as: VITAMIN B1 Take 1 tablet (100 mg total) by mouth daily. Start taking on: October 19, 2021   traZODone 50 MG tablet Commonly known as: DESYREL Take 1 tablet (50 mg total) by mouth at bedtime as needed for sleep.   Vitamin D 50 MCG (2000 UT) Caps Take 2,000 Units by mouth in the morning.        Follow-up Information      Anabel Halon, MD. Schedule an appointment as soon as possible for a visit in 10 day(s).   Specialty: Internal Medicine Contact information: [address] [place] [address] [phone]                Discharge Exam: Ceasar Mons Weights   10/17/21 1734 10/17/21 2025 10/18/21 0500  Weight: 84.4 kg 86.1 kg 85 kg   General exam: Alert, awake, oriented x 3; no acute withdrawal from alcohol appreciated.  Still having some discomfort in his right upper quadrant area.  No nausea, no vomiting, tolerating diet. Respiratory system: Clear to auscultation. Respiratory effort normal.  Good saturation on room air. Cardiovascular system:RRR. No rubs or gallops; no JVD. Gastrointestinal system: Abdomen is nondistended, soft and mild discomfort on palpation in his right quadrant region.  Positive bowel sounds appreciated. Central nervous system: Alert and oriented. No focal neurological deficits. Extremities: No cyanosis or clubbing. Skin:  No petechiae. Psychiatry: Judgement and insight appear normal. Mood & affect appropriate.    Condition at discharge: Stable and improved.  The results of significant diagnostics from this hospitalization (including imaging, microbiology, ancillary and laboratory) are listed below for reference.   Imaging Studies: CT ABDOMEN PELVIS W CONTRAST  Result Date: 10/17/2021 CLINICAL DATA:  Increasing abdominal pain for 2 weeks, history of hepatitis EXAM: CT ABDOMEN AND PELVIS WITH CONTRAST TECHNIQUE: Multidetector CT imaging of the abdomen and pelvis was performed using the standard protocol following bolus administration of intravenous contrast. RADIATION DOSE REDUCTION: This exam was performed according to the departmental dose-optimization program which includes automated exposure control, adjustment of the mA and/or kV according to patient size and/or use of iterative reconstruction technique. CONTRAST:  OMNIPAQUE IOHEXOL 300 MG/ML  SOLN COMPARISON:   07/10/2021, 07/11/2021 FINDINGS: Lower chest: No acute pleural or parenchymal lung disease. Hepatobiliary: Hepatomegaly and severe hepatic steatosis again noted. Subtle nodularity of the liver capsule consistent with cirrhosis. No focal parenchymal liver abnormality. The gallbladder is unremarkable. No biliary duct dilation. Pancreas: Stable pancreatic atrophy. Normal parenchymal enhancement. No inflammatory change or pancreatic duct dilation. Spleen: Stable splenomegaly.  No focal parenchymal abnormality. Adrenals/Urinary Tract: Adrenal glands are unremarkable. Kidneys are normal, without renal calculi, focal lesion, or hydronephrosis. Bladder is unremarkable. Stomach/Bowel: No bowel obstruction or ileus. There is mild colonic wall thickening from the cecum through the sigmoid colon, nonspecific given decompressed state. Normal appendix right lower quadrant. Vascular/Lymphatic: Stable borderline adenopathy at the porta hepatis, measuring up to 10 mm in short axis. No other pathologic adenopathy within the remaining abdomen or pelvis. Small gastric varices are noted consistent with portal venous hypertension. The splenic vein, SMV, and portal vein are patent. No other significant vascular findings. Reproductive: Prostate is unremarkable. Other: Trace ascites surrounding the liver and within the lower pelvis. No free intraperitoneal gas. No abdominal wall hernia. Musculoskeletal: No acute or destructive bony lesions. Reconstructed images demonstrate no additional findings. IMPRESSION: 1. Cirrhosis, with evidence of portal venous hypertension manifested by trace ascites, splenomegaly, and gastric varices. 2. Hepatomegaly, with diffuse hepatic steatosis unchanged. 3. Mild colonic wall thickening, nonspecific given presence of ascites and decompressed state of the colon. Underlying inflammatory or infectious colitis cannot be excluded. 4. Stable borderline size lymphadenopathy at the porta hepatis. Electronically Signed    By: Sharlet Salina M.D.   On: 10/17/2021 19:23    Microbiology: Results for orders placed or performed during the hospital encounter of 01/12/20  Novel Coronavirus, NAA ([facility])     Status: None   Collection Time: 01/12/20  8:54 AM   Specimen: Nasopharyngeal(NP) swabs in vial transport medium   Nasopharynge  Patient  Result Value Ref Range Status   SARS-CoV-2, NAA Not Detected Not Detected Final    Comment: Testing was performed using the cobas(R) SARS-CoV-2 test. This nucleic acid amplification test was developed and its performance characteristics determined by [employer]. Nucleic acid amplification tests include RT-PCR and TMA. This test has not been FDA cleared or approved. This test has been authorized by FDA under an Emergency Use Authorization (EUA). This test is only authorized for the duration of time the declaration that circumstances exist justifying the authorization of the emergency use of in vitro diagnostic tests for detection of SARS-CoV-2 virus and/or diagnosis of COVID-19 infection under section 564(b)(1) of the Act, 21 U.S.C. 244WNU-2(V) (1), unless the authorization is terminated or revoked sooner. When diagnostic testing is negative, the possibility of a false negative result should be considered  in the context of a patient's recent exposures and the presence of clinical signs and symptoms consistent with COVID-19. An individual without sympto ms of COVID-19 and who is not shedding SARS-CoV-2 virus would expect to have a negative (not detected) result in this assay.   SARS-COV-2, NAA 2 DAY TAT     Status: None   Collection Time: 01/12/20  8:54 AM   Nasopharynge  Patient  Result Value Ref Range Status   SARS-CoV-2, NAA 2 DAY TAT Performed  Final    Labs: CBC: Recent Labs  Lab 10/17/21 1803 10/18/21 0433  WBC 5.1 4.5  NEUTROABS  --  2.3  HGB 14.3 13.8  HCT 42.2 41.5  MCV 90.0 90.2  PLT 115* 99*   Basic Metabolic Panel: Recent Labs   Lab 10/17/21 1803 10/18/21 0433  NA 135 137  K 3.9 4.2  CL 102 103  CO2 26 27  GLUCOSE 93 87  BUN <5* <5*  CREATININE 0.60* 0.58*  CALCIUM 8.0* 8.2*  MG  --  1.4*   Liver Function Tests: Recent Labs  Lab 10/17/21 1803 10/18/21 0433  AST 209* 170*  ALT 75* 63*  ALKPHOS 202* 172*  BILITOT 6.1* 7.3*  PROT 6.8 6.0*  ALBUMIN 2.6* 2.3*   CBG: No results for input(s): "GLUCAP" in the last 168 hours.  Discharge time spent: greater than 30 minutes.  Signed: Vassie Loll, MD [facility] 10/18/2021

## 2021-10-18 NOTE — Assessment & Plan Note (Signed)
-   Patient has been discharged on aspirin lactone -Based on fluctuation in his vital signs low-dose beta-blocker recommended -Follow closely clinical response.

## 2021-10-20 ENCOUNTER — Telehealth: Payer: Self-pay

## 2021-10-20 NOTE — Telephone Encounter (Signed)
Transition Care Management Follow-up Telephone Call Date of discharge and from where: 10/18/21 [facility] How have you been since you were released from the hospital? ok Any questions or concerns? No  Items Reviewed: Did the pt receive and understand the discharge instructions provided? Yes  Medications obtained and verified? Yes  Other?  N/a Any new allergies since your discharge? No  Dietary orders reviewed? Yes Do you have support at home? Yes   Home Care and Equipment/Supplies: Were home health services ordered? no If so, what is the name of the agency? N/a  Has the agency set up a time to come to the patient's home? no Were any new equipment or medical supplies ordered?  No What is the name of the medical supply agency? N/a Were you able to get the supplies/equipment? not applicable Do you have any questions related to the use of the equipment or supplies? No  Functional Questionnaire: (I = Independent and D = Dependent) ADLs: I  Bathing/Dressing- I  Meal Prep- I  Eating- I  Maintaining continence- I  Transferring/Ambulation- I  Managing Meds- I  Follow up appointments reviewed:  PCP Hospital f/u appt confirmed? Yes  Scheduled to see Allena Katz on 10/27/21 @ 10:20. Specialist Hospital f/u appt confirmed? Yes  Scheduled to see Gastro on 10/21/21 . Are transportation arrangements needed? No  If their condition worsens, is the pt aware to call PCP or go to the Emergency Dept.? Yes Was the patient provided with contact information for the PCP's office or ED? Yes Was to pt encouraged to call back with questions or concerns? Yes

## 2021-10-22 ENCOUNTER — Encounter: Payer: Self-pay | Admitting: Internal Medicine

## 2021-10-22 ENCOUNTER — Ambulatory Visit (INDEPENDENT_AMBULATORY_CARE_PROVIDER_SITE_OTHER): Payer: Managed Care, Other (non HMO) | Admitting: Internal Medicine

## 2021-10-22 VITALS — BP 112/79 | HR 82 | Temp 98.0°F | Ht 70.0 in | Wt 185.0 lb

## 2021-10-22 DIAGNOSIS — K7011 Alcoholic hepatitis with ascites: Secondary | ICD-10-CM

## 2021-10-22 DIAGNOSIS — K766 Portal hypertension: Secondary | ICD-10-CM

## 2021-10-22 DIAGNOSIS — K7460 Unspecified cirrhosis of liver: Secondary | ICD-10-CM | POA: Insufficient documentation

## 2021-10-22 DIAGNOSIS — R1011 Right upper quadrant pain: Secondary | ICD-10-CM | POA: Diagnosis not present

## 2021-10-22 DIAGNOSIS — K7031 Alcoholic cirrhosis of liver with ascites: Secondary | ICD-10-CM

## 2021-10-22 MED ORDER — CHLORDIAZEPOXIDE HCL 5 MG PO CAPS: ORAL_CAPSULE | ORAL | 0 refills | Status: DC

## 2021-10-22 MED ORDER — PANTOPRAZOLE SODIUM 40 MG PO TBEC
40.0000 mg | DELAYED_RELEASE_TABLET | Freq: Two times a day (BID) | ORAL | 11 refills | Status: DC
Start: 2021-10-22 — End: 2022-12-23

## 2021-10-22 NOTE — Patient Instructions (Signed)
I am going to check blood work today at [facility].  I am going to send in more Librium to complete your full taper.  I will refill your pantoprazole.  You absolutely need to abstain from any alcohol going forward.  This is the most critical thing you can do.  Okay to stop spironolactone since your fluid status is improved.  Follow-up with GI in 4 weeks.  It was nice seeing you again today.  Dr. Marletta Lor

## 2021-10-22 NOTE — Progress Notes (Signed)
Referring Provider: Anabel Halon, MD Primary Care Physician:  Anabel Halon, MD Primary GI:  Dr. Marletta Lor  Chief Complaint  Patient presents with   Hospitalization Follow-up    Hospitalization follow up. Feeling better now. Still having some pain in RLQ. Prescribed librax in hospital but states quantity does not match directions. Needs new rx for protonix with bid directions.    HPI:   Jake King is a 31 y.o. male who presents to clinic today for follow-up visit.  History of alcoholic hepatitis, chronic elevated LFTs dating back to 2021.  His liver function tests have showed a AST predominance over ALT consistent with alcoholic hepatitis.  Full serological work-up unremarkable including negative HIV, negative hepatitis C, negative hepatitis B, GGT elevated, negative ANA, AMA, ASMA.  Iron studies abnormal with recommended HFE testing, unsure if this has been completed.  IgA elevated consistent with alcoholic hepatitis as well.  [place] Jan 2022: moderate steatosis.   History of chronic alcohol abuse.  States he was drinking 24 ounce Mike's hard lemonade 8% up to 8-9 daily.  Admitted to our hospital April 2023.  Treated with 28-day course of prednisolone for alcoholic hepatitis.  His LFTs improved significantly.  Patient abstained from alcohol after that hospitalization for multiple months though went to a cookout and said he drank a few beers which led to a full relapse.  Notes drinking 6-8 beers a day.  Presented to ER 10/17/2021 with abdominal swelling and pain.  Blood work showed AST 209, ALT 75, T. bili 6.1 (previously improved to 2.8).  CT showed cirrhosis with evidence of portal hypertension, ascites, splenomegaly, gastric varices, mild colon wall thickening.  Patient was observed overnight, discharged the next day on Librium, spironolactone, thiamine, folic acid.  Today, states he is feeling better.  Currently on a Librium taper but states he does not have enough pills to  complete taper as instructed.  Denies any abdominal swelling or lower extremity edema.  Does note some discomfort though this is much improved.  States he has been sober since presenting to the ER on 10/17/2021.  States he has realized that he cannot have any alcohol whatsoever.  Motivated to avoid going forward.  Imaging: [place] Jan 2022: moderate steatosis.   CT abdomen pelvis 07/10/2021 showed mild acute pancreatitis, marked hepatomegaly, severe hepatic steatosis, small volume ascites, splenomegaly.  MRI 07/11/2021 showed hepatomegaly, severe hepatic steatosis, subtle hepatic contour nodularity suggestive of early cirrhosis, splenomegaly, trace ascites cholelithiasis and gallbladder wall thickening likely sequela of hepatocellular disease.  Did mention subtle wall thickening of the ascending colon hepatic flexure likely reactive.  CT 10/17/2021 showed cirrhosis with evidence of portal hypertension, ascites, splenomegaly, gastric varices, mild colon wall thickening.  Past Medical History:  Diagnosis Date   Brain mass    pineal cyst   Hepatitis    Hypertension    Migraine    Right knee injury    Seizures (HCC)    Vitamin D deficiency     Past Surgical History:  Procedure Laterality Date   BIOPSY  06/10/2021   Procedure: BIOPSY;  Surgeon: Lanelle Bal, DO;  Location: [facility];  Service: Endoscopy;;   ESOPHAGOGASTRODUODENOSCOPY (EGD) WITH PROPOFOL N/A 06/10/2021   Procedure: ESOPHAGOGASTRODUODENOSCOPY (EGD) WITH PROPOFOL;  Surgeon: Lanelle Bal, DO;  Location: [facility];  Service: Endoscopy;  Laterality: N/A;  11:15am, ASA 2   NASAL SINUS SURGERY     NASAL SINUS SURGERY  2009    Current Outpatient Medications  Medication Sig Dispense Refill   albuterol (VENTOLIN HFA) 108 (90 Base) MCG/ACT inhaler Inhale 2 puffs into the lungs every 4 (four) hours as needed for wheezing or shortness of breath. 18 g 5   chlordiazePOXIDE (LIBRIUM) 5 MG capsule Take 2 tablets by mouth twice a  day x1 day; then 1 tablet by mouth 3 times a day x1 day; then 1 tablet by mouth twice a day x2 days; then 1 tablet by mouth daily x3 days and stop Librium. 14 capsule 0   Cholecalciferol (VITAMIN D) 50 MCG (2000 UT) CAPS Take 2,000 Units by mouth in the morning.     cycloSPORINE (RESTASIS) 0.05 % ophthalmic emulsion Place 1 drop into both eyes 2 (two) times daily.     folic acid (FOLVITE) 1 MG tablet Take 1 tablet (1 mg total) by mouth daily. 30 tablet 2   Multiple Vitamins-Minerals (MULTIVITAMIN WITH MINERALS) tablet Take 1 tablet by mouth in the morning.     nicotine (NICODERM CQ - DOSED IN MG/24 HOURS) 14 mg/24hr patch Place 1 patch (14 mg total) onto the skin daily. 30 patch 1   spironolactone (ALDACTONE) 25 MG tablet Take 1 tablet (25 mg total) by mouth daily. 30 tablet 1   thiamine (VITAMIN B1) 100 MG tablet Take 1 tablet (100 mg total) by mouth daily. 30 tablet 1   traZODone (DESYREL) 50 MG tablet Take 1 tablet (50 mg total) by mouth at bedtime as needed for sleep. 30 tablet 3   pantoprazole (PROTONIX) 40 MG tablet Take 1 tablet (40 mg total) by mouth 2 (two) times daily. Take 30 minutes before breakfast 60 tablet 11   No current facility-administered medications for this visit.    Allergies as of 10/22/2021 - Review Complete 10/22/2021  Allergen Reaction Noted   Atropine Swelling 03/04/2020   Other Swelling 07/02/2020    Family History  Problem Relation Age of Onset   Other Mother        "Twisted Colon"   Mental illness Mother    Diabetes Father    Hypertension Father    Pneumonia Father    Liver disease Father        unsure what   Ulcerative colitis Paternal Grandmother    Colon polyps Paternal Grandmother    Congestive Heart Failure Paternal Grandfather    Hypertension Paternal Grandfather    Diabetes Paternal Grandfather    Multiple sclerosis Paternal Grandfather    Colon cancer Neg Hx     Social History   Socioeconomic History   Marital status: Significant Other     Spouse name: [name]   Number of children: 1   Years of education: GED   Highest education level: Not on file  Occupational History   Occupation: [employer]  Tobacco Use   Smoking status: Every Day    Packs/day: 0.50    Types: Cigarettes    Passive exposure: Current   Smokeless tobacco: Former  Substance and Sexual Activity   Alcohol use: Not Currently    Alcohol/week: 2.0 - 3.0 standard drinks of alcohol    Types: 2 - 3 Cans of beer per week    Comment: 3-4 beers every night    Drug use: Not Currently    Comment: no drugs since 2018   Sexual activity: Not on file  Other Topics Concern   Not on file  Social History Narrative   Lives with Salvadore Dom [name]) and their kids   Caffeine use: none   Right handed  Social Determinants of Health   Financial Resource Strain: Not on file  Food Insecurity: Not on file  Transportation Needs: Not on file  Physical Activity: Not on file  Stress: Not on file  Social Connections: Not on file    Subjective: Review of Systems  Constitutional:  Positive for malaise/fatigue. Negative for chills and fever.  HENT:  Negative for congestion and hearing loss.   Eyes:  Negative for blurred vision and double vision.  Respiratory:  Negative for cough and shortness of breath.   Cardiovascular:  Negative for chest pain and palpitations.  Gastrointestinal:  Positive for abdominal pain. Negative for blood in stool, constipation, diarrhea, heartburn, melena and vomiting.  Genitourinary:  Negative for dysuria and urgency.  Musculoskeletal:  Negative for joint pain and myalgias.  Skin:  Negative for itching and rash.       Jaundice  Neurological:  Negative for dizziness and headaches.  Psychiatric/Behavioral:  Negative for depression. The patient is not nervous/anxious.      Objective: BP 112/79 (BP Location: Left Arm, Patient Position: Sitting, Cuff Size: Normal)   Pulse 82   Temp 98 F (36.7 C) (Oral)   Ht 5\' 10"  (1.778 m)   Wt 185 lb  (83.9 kg)   BMI 26.54 kg/m  Physical Exam Constitutional:      Appearance: Normal appearance. He is ill-appearing.  HENT:     Head: Normocephalic and atraumatic.  Eyes:     General: Scleral icterus present.     Extraocular Movements: Extraocular movements intact.  Cardiovascular:     Rate and Rhythm: Regular rhythm. Tachycardia present.  Pulmonary:     Effort: Pulmonary effort is normal.     Breath sounds: Normal breath sounds.  Abdominal:     General: Bowel sounds are normal. There is distension.     Palpations: Abdomen is soft.     Tenderness: There is abdominal tenderness.  Musculoskeletal:        General: Normal range of motion.     Cervical back: Normal range of motion and neck supple.     Right lower leg: Edema present.     Left lower leg: Edema present.  Skin:    General: Skin is warm.     Coloration: Skin is jaundiced.  Neurological:     General: No focal deficit present.     Mental Status: He is alert and oriented to person, place, and time.  Psychiatric:        Mood and Affect: Mood normal.        Behavior: Behavior normal.      Assessment: *Alcoholic hepatitis *Alcoholic cirrhosis *Portal hypertension with ascites, splenomegaly, gastric varices *Abdominal discomfort-improved  Plan: Discussed patient's condition with him in depth today.  I am going to recheck MELD labs today.  No INR on recent hospitalization.  Can consider another course of prednisolone.  Counseled on the vast importance of absolute alcohol cessation.  Patient realizes that he cannot have any alcohol going forward.  He states he is motivated to remain sober for the rest of his life.    After reviewing the Librium prescription, it does appear that he does not have enough pills to complete the taper as instructed.  I will send in enough pills for him to complete the taper which should be finished in the next 7 days.  Patient's fluid status much improved.  Okay to hold spironolactone for  now.  Continue on thiamine and folic acid.  Consider hepatology referral pending clinical course.  Follow-up in 4 weeks.  10/22/2021 10:01 AM   Disclaimer: This note was dictated with voice recognition software. Similar sounding words can inadvertently be transcribed and may not be corrected upon review.

## 2021-10-23 LAB — COMPLETE METABOLIC PANEL WITH GFR
AG Ratio: 0.8 (calc) — ABNORMAL LOW (ref 1.0–2.5)
ALT: 49 U/L — ABNORMAL HIGH (ref 9–46)
AST: 113 U/L — ABNORMAL HIGH (ref 10–40)
Albumin: 3 g/dL — ABNORMAL LOW (ref 3.6–5.1)
Alkaline phosphatase (APISO): 180 U/L — ABNORMAL HIGH (ref 36–130)
BUN: 8 mg/dL (ref 7–25)
CO2: 27 mmol/L (ref 20–32)
Calcium: 8.5 mg/dL — ABNORMAL LOW (ref 8.6–10.3)
Chloride: 103 mmol/L (ref 98–110)
Creat: 0.78 mg/dL (ref 0.60–1.24)
Globulin: 3.8 g/dL (calc) — ABNORMAL HIGH (ref 1.9–3.7)
Glucose, Bld: 80 mg/dL (ref 65–99)
Potassium: 4 mmol/L (ref 3.5–5.3)
Sodium: 137 mmol/L (ref 135–146)
Total Bilirubin: 8.2 mg/dL — ABNORMAL HIGH (ref 0.2–1.2)
Total Protein: 6.8 g/dL (ref 6.1–8.1)
eGFR: 124 mL/min/{1.73_m2} (ref >=60)

## 2021-10-23 LAB — PROTIME-INR
INR: 1.2 — ABNORMAL HIGH
Prothrombin Time: 12.5 s — ABNORMAL HIGH (ref 9.0–11.5)

## 2021-10-27 ENCOUNTER — Inpatient Hospital Stay: Payer: Managed Care, Other (non HMO) | Admitting: Internal Medicine

## 2021-10-29 ENCOUNTER — Telehealth: Payer: Self-pay

## 2021-10-29 NOTE — Telephone Encounter (Signed)
Patient's insurance will only pay for once daily pantoprazole and refill for twice daily was recently sent in. Looking back at when he was started on the twice daily regimen back in March he was only supposed to be on that dosage for 12 weeks and then he was to go back to once daily dosing. Do you want him to stay on twice daily or can he go back to once daily. Please advise.

## 2021-10-31 NOTE — Telephone Encounter (Signed)
He can go to once daily.  Thank you

## 2021-11-03 NOTE — Telephone Encounter (Signed)
Spoke with patient and he was able to use an rx card to get it for $17. Pt is currently taking it twice daily, states that it was increased back to twice daily at his last ov.

## 2021-11-19 ENCOUNTER — Ambulatory Visit: Payer: Managed Care, Other (non HMO) | Admitting: Internal Medicine

## 2021-11-21 ENCOUNTER — Ambulatory Visit: Payer: Managed Care, Other (non HMO) | Admitting: Nurse Practitioner

## 2021-11-21 ENCOUNTER — Encounter: Payer: Self-pay | Admitting: Nurse Practitioner

## 2021-11-21 DIAGNOSIS — J309 Allergic rhinitis, unspecified: Secondary | ICD-10-CM | POA: Diagnosis not present

## 2021-11-21 MED ORDER — BENZONATATE 100 MG PO CAPS: 100.0000 mg | ORAL_CAPSULE | Freq: Two times a day (BID) | ORAL | 0 refills | Status: DC | PRN

## 2021-11-21 NOTE — Progress Notes (Signed)
Virtual Visit via Telephone Note  I connected with Jake King @ on 11/21/21 at 1512pm by telephone and verified that I am speaking with the correct person using two identifiers.  Location: Patient: home Provider: office   I discussed the limitations, risks, security and privacy concerns of performing an evaluation and management service by telephone and the availability of in person appointments. I also discussed with the patient that there may be a patient responsible charge related to this service. The patient expressed understanding and agreed to proceed.   History of Present Illness: Jake King with PMH primary hypertension, alcoholic hepatitis, allergic rhinitis, tobacco abuse, vitamin D deficiency presents with complaints of Fever, chest congestion, stuffy nose, sinus pressure, wheezing, SOB  started on 4 days .  He took Sudafed and ibuprofen.  Home COVID test was negative.  Patient reports that he feels much better today.  Smoking cessation encouraged   Observations/Objective:   Assessment and Plan: Allergic rhinitis Symptoms of chest congestion stuffy nose sinus pressure wheezing shortness of breath much better.  RX Tessalon 100 mg twice daily as needed Continue OTC ibuprofen as needed for fever Continue azelastine nasal spray Patient told to call back the office if his symptoms does not improve in a week  Smoking cessation encouraged Uses albuterol inhaler as needed for wheezing   Follow Up Instructions:    I discussed the assessment and treatment plan with the patient. The patient was provided an opportunity to ask questions and all were answered. The patient agreed with the plan and demonstrated an understanding of the instructions.   The patient was advised to call back or seek an in-person evaluation if the symptoms worsen or if the condition fails to improve as anticipated.

## 2021-11-21 NOTE — Assessment & Plan Note (Addendum)
Symptoms of chest congestion stuffy nose sinus pressure wheezing shortness of breath much better.  RX Tessalon 100 mg twice daily as needed Continue OTC ibuprofen as needed for fever Continue azelastine nasal spray Patient told to call back the office if his symptoms does not improve in a week  Smoking cessation encouraged Uses albuterol inhaler as needed for wheezing

## 2021-11-24 ENCOUNTER — Telehealth: Payer: Self-pay | Admitting: Internal Medicine

## 2021-11-24 NOTE — Telephone Encounter (Signed)
Pt called stating he is needing a out of work note for being out all last week. Can you please write this?

## 2021-11-24 NOTE — Telephone Encounter (Signed)
This will need to come from [facility] as she saw the patient last please route accordingly

## 2021-11-25 ENCOUNTER — Encounter: Payer: Self-pay | Admitting: Nurse Practitioner

## 2021-11-25 NOTE — Telephone Encounter (Signed)
Please advise 

## 2021-11-25 NOTE — Telephone Encounter (Signed)
Not sent via mychart pt aware

## 2021-12-09 ENCOUNTER — Ambulatory Visit: Payer: Managed Care, Other (non HMO) | Admitting: Internal Medicine

## 2022-01-04 IMAGING — MR MR HEAD WO/W CM
16 of 18 series · 40 of 48 positions shown · IV contrast (gadavist)
Comparison: None.

CLINICAL DATA: Headache. Motor neuron disease. Limb weakness and
sensory changes.

EXAM:
MRI HEAD WITHOUT AND WITH CONTRAST
TECHNIQUE: Multiplanar, multiecho pulse sequences of the brain and surrounding
structures were obtained without and with intravenous contrast.
CONTRAST:  8mL GADAVIST GADOBUTROL 1 MMOL/ML IV SOLN

[Series 5: DWI · axial · 3.0mm · 0.77mm/px · z∈[-67,+73]mm · 4 of 48 slices shown (1 of 6)]
[im 1/48]
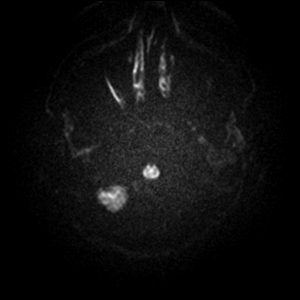
[im 16/48]
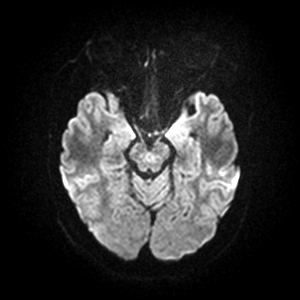
[im 32/48]
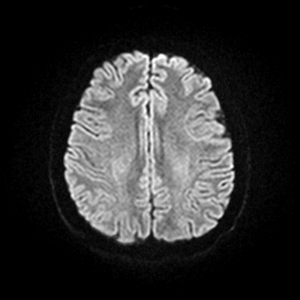
[im 48/48]
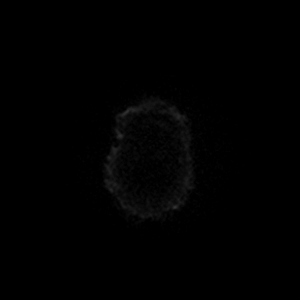

[Series 5: DWI · axial · 3.0mm · 0.77mm/px · z∈[-67,+73]mm · 4 of 48 slices shown (2 of 6)]
[im 1/48]
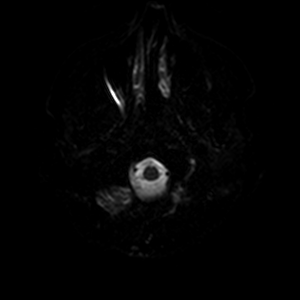
[im 16/48]
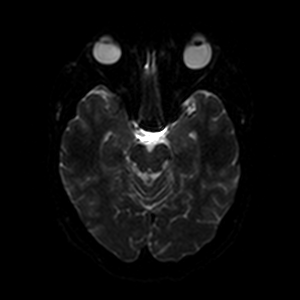
[im 32/48]
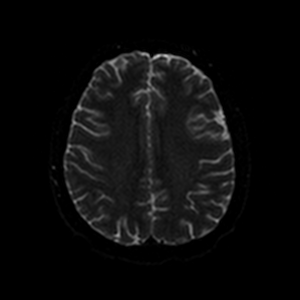
[im 48/48]
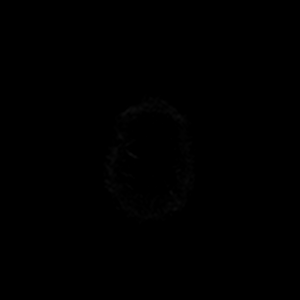

[Series 6: DWI · axial · 3.0mm · 0.77mm/px · z∈[-67,+73]mm · 3 of 48 slices shown (3 of 6)]
[im 1/48]
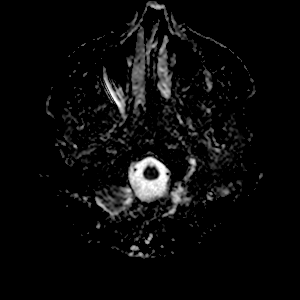
[im 24/48]
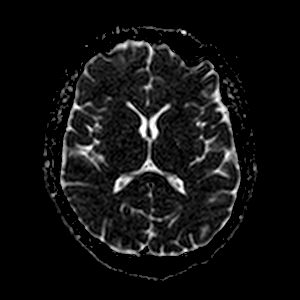
[im 48/48]
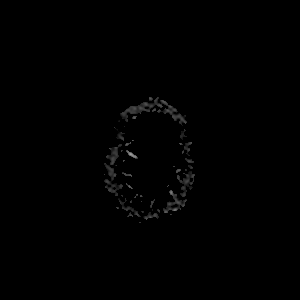

[Series 7: DWI · coronal · 5.0mm · 0.88mm/px · 2 of 28 slices shown (4 of 6)]
[im 1/28]
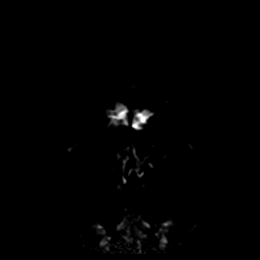
[im 28/28]
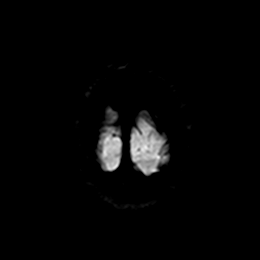

[Series 7: DWI · coronal · 5.0mm · 0.88mm/px · 2 of 28 slices shown (5 of 6)]
[im 1/28]
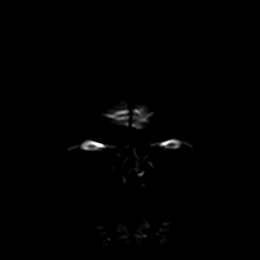
[im 28/28]
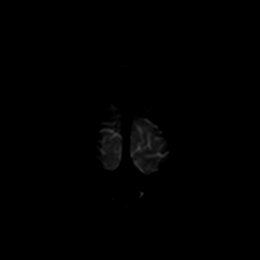

[Series 8: DWI · coronal · 5.0mm · 0.88mm/px · 2 of 28 slices shown (6 of 6)]
[im 1/28]
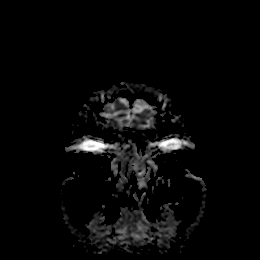
[im 28/28]
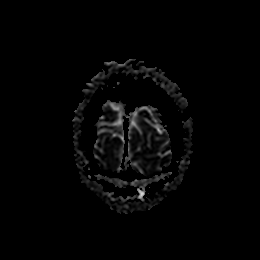

[Series 9: T1 · sagittal · 5.0mm · 0.75mm/px · 1 of 19 slices shown]
[im 1/19]
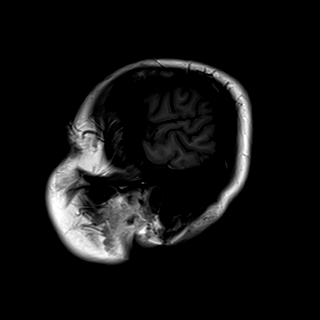

[Series 10: T2 · axial · 5.0mm · 0.72mm/px · 1 of 20 slices shown]
[im 1/20]
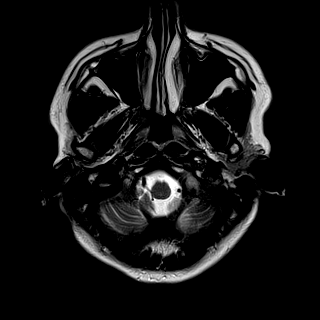

[Series 11: mag_images · axial · 3.0mm · 0.90mm/px · z∈[-78,+98]mm · 4 of 60 slices shown]
[im 1/60]
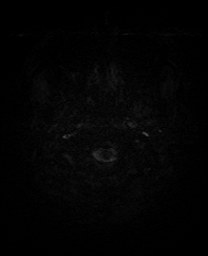
[im 20/60]
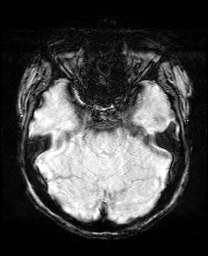
[im 40/60]
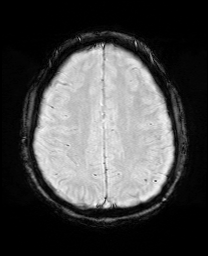
[im 60/60]
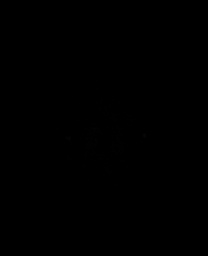

[Series 12: pha_images · axial · 3.0mm · 0.90mm/px · z∈[-76,+89]mm · 4 of 56 slices shown]
[im 1/56]
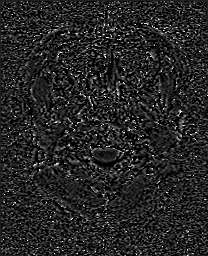
[im 19/56]
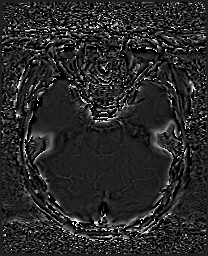
[im 37/56]
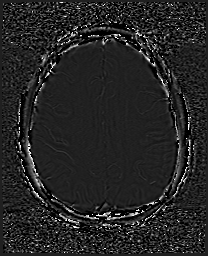
[im 56/56]
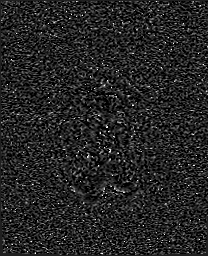

[Series 13: swi_images · axial · 3.0mm · 0.90mm/px · z∈[-78,+98]mm · 4 of 60 slices shown]
[im 1/60]
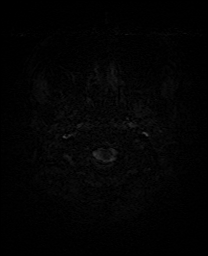
[im 20/60]
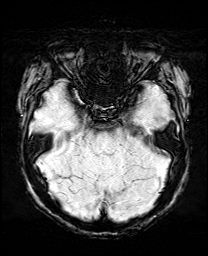
[im 40/60]
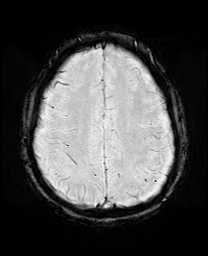
[im 60/60]
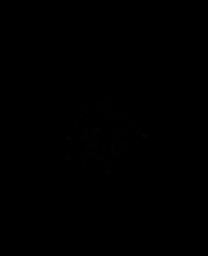

[Series 15: FLAIR · axial · 4.0mm · 0.43mm/px · z∈[-55,+73]mm · 2 of 33 slices shown (1 of 2)]
[im 1/33]
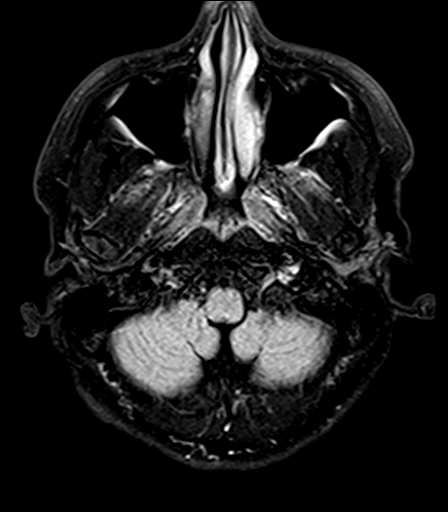
[im 33/33]
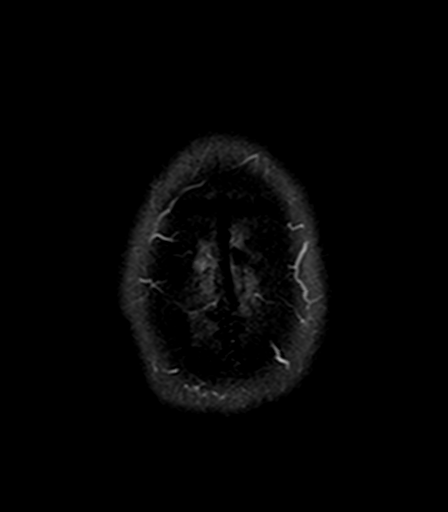

[Series 16: FLAIR · sagittal · 5.0mm · 0.94mm/px · 2 of 25 slices shown (2 of 2)]
[im 1/25]
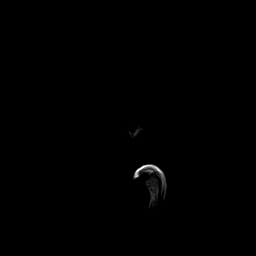
[im 25/25]
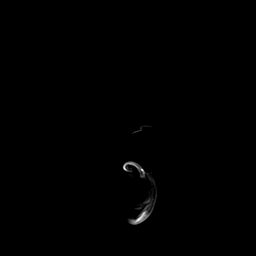

[Series 33: T2 post-contrast · coronal · 5.0mm · 0.72mm/px · 2 of 28 slices shown]
[im 1/28]
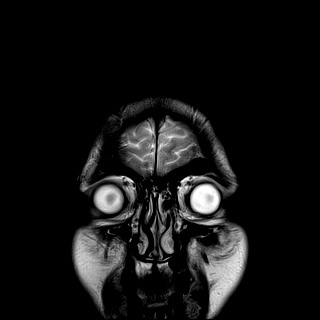
[im 28/28]
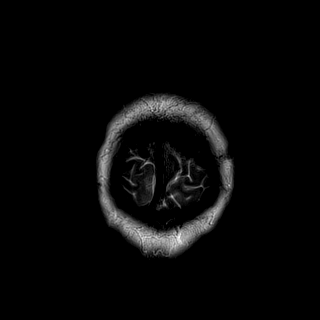

[Series 35: T1 post-contrast · coronal · 5.0mm · 0.34mm/px · 2 of 28 slices shown (1 of 2)]
[im 1/28]
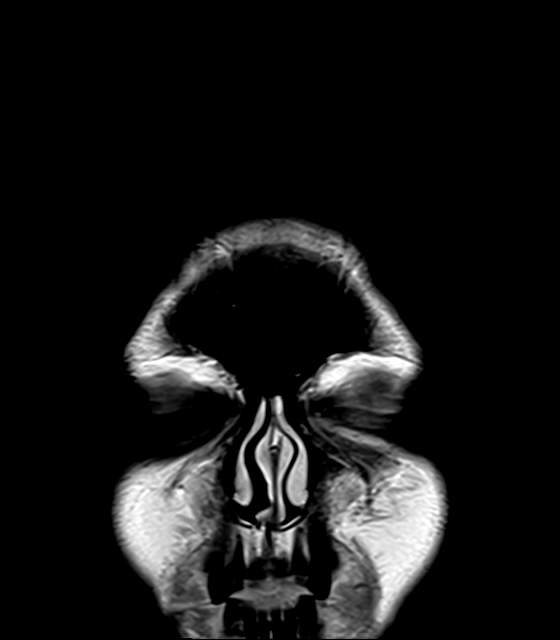
[im 28/28]
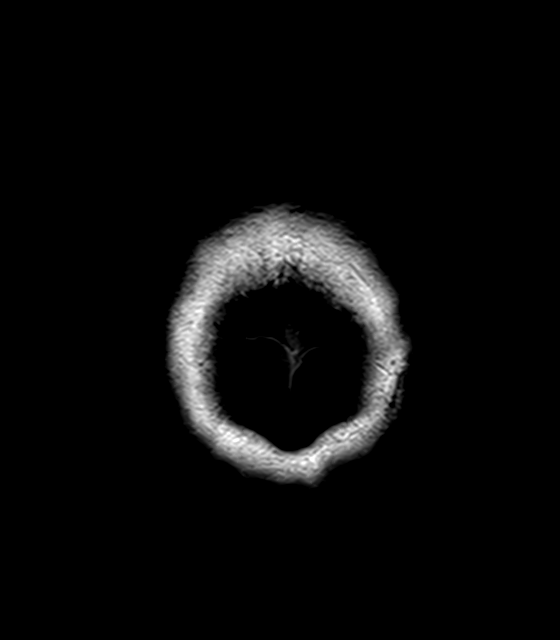

[Series 36: T1 post-contrast · sagittal · 5.0mm · 0.72mm/px · 1 of 19 slices shown (2 of 2)]
[im 1/19]
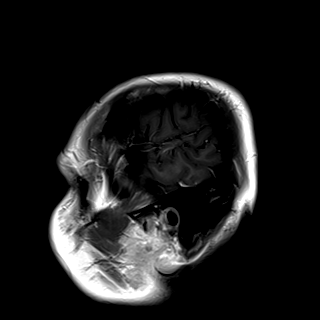

[40 of 48 positions shown; findings below may reference images not displayed]

FINDINGS: Brain: No acute infarction, hemorrhage, hydrocephalus, extra-axial
collection or mass lesion. Normal cerebral white matter

Pineal cyst measuring 9 x 12 mm with internal septation. No
significant enhancement.

Vascular: Normal arterial flow voids.

Skull and upper cervical spine: No focal skeletal abnormality.

Sinuses/Orbits: Minimal mucosal edema paranasal sinuses. Normal
orbit

Other: None
IMPRESSION: Incidental pineal cyst. Otherwise normal MRI of the brain with
contrast.

Mild mucosal edema paranasal sinuses.

## 2022-01-09 ENCOUNTER — Ambulatory Visit: Payer: Managed Care, Other (non HMO) | Admitting: Internal Medicine

## 2022-01-12 ENCOUNTER — Encounter: Payer: Self-pay | Admitting: Internal Medicine

## 2022-01-16 DIAGNOSIS — F1120 Opioid dependence, uncomplicated: Secondary | ICD-10-CM | POA: Diagnosis not present

## 2022-03-01 ENCOUNTER — Inpatient Hospital Stay (HOSPITAL_COMMUNITY)
Admission: EM | Admit: 2022-03-01 | Discharge: 2022-03-05 | DRG: 433 | Disposition: A | Discharge status: Home / Self Care | Payer: BC Managed Care – PPO | Attending: Internal Medicine | Admitting: Internal Medicine

## 2022-03-01 ENCOUNTER — Encounter (HOSPITAL_COMMUNITY): Payer: Self-pay | Admitting: *Deleted

## 2022-03-01 ENCOUNTER — Other Ambulatory Visit: Payer: Self-pay

## 2022-03-01 ENCOUNTER — Emergency Department (HOSPITAL_COMMUNITY): Payer: BC Managed Care – PPO

## 2022-03-01 DIAGNOSIS — I10 Essential (primary) hypertension: Secondary | ICD-10-CM | POA: Diagnosis present

## 2022-03-01 DIAGNOSIS — D649 Anemia, unspecified: Secondary | ICD-10-CM | POA: Diagnosis not present

## 2022-03-01 DIAGNOSIS — R162 Hepatomegaly with splenomegaly, not elsewhere classified: Secondary | ICD-10-CM | POA: Diagnosis present

## 2022-03-01 DIAGNOSIS — E559 Vitamin D deficiency, unspecified: Secondary | ICD-10-CM | POA: Diagnosis present

## 2022-03-01 DIAGNOSIS — K7011 Alcoholic hepatitis with ascites: Secondary | ICD-10-CM | POA: Diagnosis not present

## 2022-03-01 DIAGNOSIS — F32A Depression, unspecified: Secondary | ICD-10-CM | POA: Diagnosis not present

## 2022-03-01 DIAGNOSIS — Z818 Family history of other mental and behavioral disorders: Secondary | ICD-10-CM

## 2022-03-01 DIAGNOSIS — Y901 Blood alcohol level of 20-39 mg/100 ml: Secondary | ICD-10-CM | POA: Diagnosis present

## 2022-03-01 DIAGNOSIS — F1120 Opioid dependence, uncomplicated: Secondary | ICD-10-CM

## 2022-03-01 DIAGNOSIS — R109 Unspecified abdominal pain: Secondary | ICD-10-CM | POA: Diagnosis not present

## 2022-03-01 DIAGNOSIS — R7401 Elevation of levels of liver transaminase levels: Secondary | ICD-10-CM | POA: Diagnosis not present

## 2022-03-01 DIAGNOSIS — Z820 Family history of epilepsy and other diseases of the nervous system: Secondary | ICD-10-CM | POA: Diagnosis not present

## 2022-03-01 DIAGNOSIS — K7460 Unspecified cirrhosis of liver: Secondary | ICD-10-CM | POA: Diagnosis not present

## 2022-03-01 DIAGNOSIS — K7010 Alcoholic hepatitis without ascites: Secondary | ICD-10-CM | POA: Diagnosis not present

## 2022-03-01 DIAGNOSIS — Z888 Allergy status to other drugs, medicaments and biological substances status: Secondary | ICD-10-CM | POA: Diagnosis not present

## 2022-03-01 DIAGNOSIS — F1010 Alcohol abuse, uncomplicated: Secondary | ICD-10-CM | POA: Diagnosis present

## 2022-03-01 DIAGNOSIS — D696 Thrombocytopenia, unspecified: Secondary | ICD-10-CM | POA: Diagnosis not present

## 2022-03-01 DIAGNOSIS — Z79899 Other long term (current) drug therapy: Secondary | ICD-10-CM

## 2022-03-01 DIAGNOSIS — Z720 Tobacco use: Secondary | ICD-10-CM | POA: Diagnosis present

## 2022-03-01 DIAGNOSIS — K7030 Alcoholic cirrhosis of liver without ascites: Secondary | ICD-10-CM | POA: Diagnosis not present

## 2022-03-01 DIAGNOSIS — Z8249 Family history of ischemic heart disease and other diseases of the circulatory system: Secondary | ICD-10-CM

## 2022-03-01 DIAGNOSIS — Z83719 Family history of colon polyps, unspecified: Secondary | ICD-10-CM

## 2022-03-01 DIAGNOSIS — K219 Gastro-esophageal reflux disease without esophagitis: Secondary | ICD-10-CM | POA: Diagnosis not present

## 2022-03-01 DIAGNOSIS — K760 Fatty (change of) liver, not elsewhere classified: Secondary | ICD-10-CM | POA: Diagnosis present

## 2022-03-01 DIAGNOSIS — F17210 Nicotine dependence, cigarettes, uncomplicated: Secondary | ICD-10-CM | POA: Diagnosis not present

## 2022-03-01 DIAGNOSIS — D6959 Other secondary thrombocytopenia: Secondary | ICD-10-CM | POA: Diagnosis not present

## 2022-03-01 DIAGNOSIS — F1129 Opioid dependence with unspecified opioid-induced disorder: Secondary | ICD-10-CM | POA: Diagnosis not present

## 2022-03-01 DIAGNOSIS — Z833 Family history of diabetes mellitus: Secondary | ICD-10-CM | POA: Diagnosis not present

## 2022-03-01 DIAGNOSIS — F10239 Alcohol dependence with withdrawal, unspecified: Secondary | ICD-10-CM | POA: Diagnosis present

## 2022-03-01 DIAGNOSIS — F419 Anxiety disorder, unspecified: Secondary | ICD-10-CM | POA: Insufficient documentation

## 2022-03-01 DIAGNOSIS — Z882 Allergy status to sulfonamides status: Secondary | ICD-10-CM | POA: Diagnosis not present

## 2022-03-01 DIAGNOSIS — F10229 Alcohol dependence with intoxication, unspecified: Secondary | ICD-10-CM | POA: Diagnosis not present

## 2022-03-01 HISTORY — DX: Fatty (change of) liver, not elsewhere classified: K76.0

## 2022-03-01 HISTORY — DX: Unspecified cirrhosis of liver: K74.60

## 2022-03-01 LAB — COMPREHENSIVE METABOLIC PANEL
ALT: 76 U/L — ABNORMAL HIGH (ref 0–44)
AST: 254 U/L — ABNORMAL HIGH (ref 15–41)
Albumin: 2.4 g/dL — ABNORMAL LOW (ref 3.5–5.0)
Alkaline Phosphatase: 173 U/L — ABNORMAL HIGH (ref 38–126)
Anion gap: 7 (ref 5–15)
BUN: <5 mg/dL — ABNORMAL LOW (ref 6–20)
CO2: 28 mmol/L (ref 22–32)
Calcium: 8.1 mg/dL — ABNORMAL LOW (ref 8.9–10.3)
Chloride: 102 mmol/L (ref 98–111)
Creatinine, Ser: 0.36 mg/dL — ABNORMAL LOW (ref 0.61–1.24)
GFR, Estimated: >60 mL/min (ref >60)
Glucose, Bld: 98 mg/dL (ref 70–99)
Potassium: 4.3 mmol/L (ref 3.5–5.1)
Sodium: 137 mmol/L (ref 135–145)
Total Bilirubin: 15.3 mg/dL — ABNORMAL HIGH (ref 0.3–1.2)
Total Protein: 6.9 g/dL (ref 6.5–8.1)

## 2022-03-01 LAB — URINALYSIS, ROUTINE W REFLEX MICROSCOPIC
Bacteria, UA: NONE SEEN
Glucose, UA: NEGATIVE mg/dL
Hgb urine dipstick: NEGATIVE
Ketones, ur: NEGATIVE mg/dL
Leukocytes,Ua: NEGATIVE
Nitrite: NEGATIVE
Protein, ur: 30 mg/dL — AB
Specific Gravity, Urine: 1.018 (ref 1.005–1.030)
pH: 7 (ref 5.0–8.0)

## 2022-03-01 LAB — CBC
HCT: 36.6 % — ABNORMAL LOW (ref 39.0–52.0)
Hemoglobin: 12.3 g/dL — ABNORMAL LOW (ref 13.0–17.0)
MCH: 30.4 pg (ref 26.0–34.0)
MCHC: 33.6 g/dL (ref 30.0–36.0)
MCV: 90.4 fL (ref 80.0–100.0)
Platelets: 108 10*3/uL — ABNORMAL LOW (ref 150–400)
RBC: 4.05 MIL/uL — ABNORMAL LOW (ref 4.22–5.81)
RDW: 19.9 % — ABNORMAL HIGH (ref 11.5–15.5)
WBC: 4.2 10*3/uL (ref 4.0–10.5)
nRBC: 0 % (ref 0.0–0.2)

## 2022-03-01 LAB — ETHANOL: Alcohol, Ethyl (B): 29 mg/dL — ABNORMAL HIGH (ref <10)

## 2022-03-01 LAB — AMMONIA: Ammonia: 30 umol/L (ref 9–35)

## 2022-03-01 LAB — LIPASE, BLOOD: Lipase: 28 U/L (ref 11–51)

## 2022-03-01 LAB — PROTIME-INR
INR: 1.3 — ABNORMAL HIGH (ref 0.8–1.2)
Prothrombin Time: 16.3 seconds — ABNORMAL HIGH (ref 11.4–15.2)

## 2022-03-01 MED ORDER — HYDROMORPHONE HCL 1 MG/ML IJ SOLN
1.0000 mg | Freq: Once | INTRAMUSCULAR | Status: AC
  Administered 2022-03-01: 1 mg via INTRAVENOUS
  Filled 2022-03-01: qty 1

## 2022-03-01 MED ORDER — LORAZEPAM 1 MG PO TABS
0.0000 mg | ORAL_TABLET | Freq: Four times a day (QID) | ORAL | Status: DC
  Administered 2022-03-02 (×2): 1 mg via ORAL
  Administered 2022-03-03: 2 mg via ORAL
  Filled 2022-03-01: qty 1
  Filled 2022-03-01 (×2): qty 2

## 2022-03-01 MED ORDER — IOHEXOL 300 MG/ML  SOLN
100.0000 mL | Freq: Once | INTRAMUSCULAR | Status: AC | PRN
  Administered 2022-03-02: 100 mL via INTRAVENOUS

## 2022-03-01 MED ORDER — THIAMINE HCL 100 MG/ML IJ SOLN: 100.0000 mg | Freq: Every day | INTRAMUSCULAR | Status: DC

## 2022-03-01 MED ORDER — LORAZEPAM 2 MG/ML IJ SOLN: 0.0000 mg | Freq: Two times a day (BID) | INTRAMUSCULAR | Status: DC

## 2022-03-01 MED ORDER — LORAZEPAM 1 MG PO TABS: 0.0000 mg | ORAL_TABLET | Freq: Two times a day (BID) | ORAL | Status: DC

## 2022-03-01 MED ORDER — LORAZEPAM 2 MG/ML IJ SOLN
0.0000 mg | Freq: Four times a day (QID) | INTRAMUSCULAR | Status: DC
  Administered 2022-03-02: 2 mg via INTRAVENOUS
  Administered 2022-03-02: 1 mg via INTRAVENOUS
  Filled 2022-03-01 (×2): qty 1

## 2022-03-01 MED ORDER — ONDANSETRON HCL 4 MG/2ML IJ SOLN
4.0000 mg | Freq: Once | INTRAMUSCULAR | Status: AC
  Administered 2022-03-01: 4 mg via INTRAVENOUS
  Filled 2022-03-01: qty 2

## 2022-03-01 MED ORDER — SODIUM CHLORIDE 0.9 % IV BOLUS
1000.0000 mL | Freq: Once | INTRAVENOUS | Status: AC
  Administered 2022-03-01: 1000 mL via INTRAVENOUS

## 2022-03-01 MED ORDER — THIAMINE MONONITRATE 100 MG PO TABS
100.0000 mg | ORAL_TABLET | Freq: Every day | ORAL | Status: DC
  Administered 2022-03-02 – 2022-03-05 (×4): 100 mg via ORAL
  Filled 2022-03-01 (×4): qty 1

## 2022-03-01 NOTE — ED Notes (Signed)
Pt alert, oriented, upon this RN's assessment. VSS. Wardell Heath Gerrianne Scale

## 2022-03-01 NOTE — ED Provider Triage Note (Signed)
Emergency Medicine Provider Triage Evaluation Note  Jake King , a 31 y.o. male  was evaluated in triage.  Pt complains of jaundice and swelling   Review of Systems  Positive: Abdominal pain   Negative: fever  Physical Exam  BP (!) 140/93 (BP Location: Right Arm)   Pulse 94   Temp 99.1 F (37.3 C) (Oral)   Resp 18   Ht 5' 10.5" (1.791 m)   Wt 86.2 kg   SpO2 98%   BMI 26.88 kg/m  Gen:   Awake, uncomfortable, janudiced  Resp:  Normal effort  MSK:   Moves extremities without difficulty  Other:    Medical Decision Making  Medically screening exam initiated at 7:29 PM.  Appropriate orders placed.  Jake King was informed that the remainder of the evaluation will be completed by another provider, this initial triage assessment does not replace that evaluation, and the importance of remaining in the ED until their evaluation is complete.     Jake King, [place] 03/01/22 1930

## 2022-03-01 NOTE — ED Triage Notes (Signed)
Pt c/o severe abdominal pain and jaundice x 2 weeks. Pt reports he has a fatty liver and beginning cirrhosis and had quit drinking but started back drinking alcohol again 3 weeks ago.Pt reports he hasn't been able to keep any food or liquids down in 3 days.

## 2022-03-01 NOTE — ED Provider Notes (Cosign Needed Addendum)
[facility] EMERGENCY DEPARTMENT Provider Note   CSN: [id number] Arrival date & time: 03/01/22  1807     History  Chief Complaint  Patient presents with   Abdominal Pain    Jake King is a 31 y.o. male.  Pt complains of abdominal swelling and pain.  Patient has a history of alcoholic cirrhosis alcoholic pancreatitis and alcoholic hepatitis.  Patient reports that he had a period of sobriety but began drinking heavily over Thanksgiving.  Patient reports he has been drinking a massive amount of alcohol.  Patient complains of his skin turning yellow and a red rash  The history is provided by the patient. No language interpreter was used.  Abdominal Pain Pain location:  Generalized Pain quality: aching   Pain radiates to:  Does not radiate Pain severity:  Severe Duration:  3 weeks Timing:  Constant Progression:  Worsening Chronicity:  New Context: alcohol use   Relieved by:  Nothing Worsened by:  Nothing   The history is provided by the patient. No language interpreter was used.  Abdominal Pain      Home Medications Prior to Admission medications   Medication Sig Start Date End Date Taking? Authorizing Provider  albuterol (VENTOLIN HFA) 108 (90 Base) MCG/ACT inhaler Inhale 2 puffs into the lungs every 4 (four) hours as needed for wheezing or shortness of breath. 05/01/21   Lindell Spar, MD  benzonatate (TESSALON) 100 MG capsule Take 1 capsule (100 mg total) by mouth 2 (two) times daily as needed for cough. 11/21/21   Paseda, Dewaine Conger, FNP  chlordiazePOXIDE (LIBRIUM) 5 MG capsule Take 2 tablets by mouth twice a day x1 day; then 1 tablet by mouth 3 times a day x1 day; then 1 tablet by mouth twice a day x2 days; then 1 tablet by mouth daily x3 days and stop Librium. Patient not taking: Reported on 11/21/2021 10/22/21   Eloise Harman, DO  Cholecalciferol (VITAMIN D) 50 MCG (2000 UT) CAPS Take 2,000 Units by mouth in the morning.    [provider]   cycloSPORINE (RESTASIS) 0.05 % ophthalmic emulsion Place 1 drop into both eyes 2 (two) times daily. Patient not taking: Reported on 11/21/2021    [provider]  folic acid (FOLVITE) 1 MG tablet Take 1 tablet (1 mg total) by mouth daily. 07/13/21   Barton Dubois, MD  Multiple Vitamins-Minerals (MULTIVITAMIN WITH MINERALS) tablet Take 1 tablet by mouth in the morning.    [provider]  nicotine (NICODERM CQ - DOSED IN MG/24 HOURS) 14 mg/24hr patch Place 1 patch (14 mg total) onto the skin daily. 10/19/21   Barton Dubois, MD  pantoprazole (PROTONIX) 40 MG tablet Take 1 tablet (40 mg total) by mouth 2 (two) times daily. Take 30 minutes before breakfast 10/22/21 10/22/22  Eloise Harman, DO  spironolactone (ALDACTONE) 25 MG tablet Take 1 tablet (25 mg total) by mouth daily. Patient not taking: Reported on 11/21/2021 10/19/21   Barton Dubois, MD  thiamine (VITAMIN B1) 100 MG tablet Take 1 tablet (100 mg total) by mouth daily. 10/19/21   Barton Dubois, MD  traZODone (DESYREL) 50 MG tablet Take 1 tablet (50 mg total) by mouth at bedtime as needed for sleep. 07/15/21   Alvira Monday, FNP      Allergies    Atropine and Other    Review of Systems   Review of Systems  Gastrointestinal:  Positive for abdominal pain.    Physical Exam Updated Vital Signs BP 120/79  Pulse 80   Temp 99.1 F (37.3 C) (Oral)   Resp 14   Ht 5' 10.5" (1.791 m)   Wt 86.2 kg   SpO2 96%   BMI 26.88 kg/m  Physical Exam Vitals and nursing note reviewed.  Constitutional:      Appearance: He is well-developed.  HENT:     Head: Normocephalic.  Eyes:     General: Scleral icterus present.  Cardiovascular:     Rate and Rhythm: Normal rate and regular rhythm.  Pulmonary:     Effort: Pulmonary effort is normal.  Abdominal:     General: Abdomen is flat. There is no distension.     Palpations: Abdomen is soft. There is hepatomegaly.     Tenderness: There is generalized abdominal tenderness.   Musculoskeletal:        General: Normal range of motion.     Cervical back: Normal range of motion.  Skin:    General: Skin is warm.     Comments: Petechiae worse on face and upper chest  Neurological:     General: No focal deficit present.     Mental Status: He is alert and oriented to person, place, and time.  Psychiatric:        Mood and Affect: Mood normal.     ED Results / Procedures / Treatments   Labs (all labs ordered are listed, but only abnormal results are displayed) Labs Reviewed  COMPREHENSIVE METABOLIC PANEL - Abnormal; Notable for the following components:      Result Value   BUN <5 (*)    Creatinine, Ser 0.36 (*)    Calcium 8.1 (*)    Albumin 2.4 (*)    AST 254 (*)    ALT 76 (*)    Alkaline Phosphatase 173 (*)    Total Bilirubin 15.3 (*)    All other components within normal limits  CBC - Abnormal; Notable for the following components:   RBC 4.05 (*)    Hemoglobin 12.3 (*)    HCT 36.6 (*)    RDW 19.9 (*)    Platelets 108 (*)    All other components within normal limits  URINALYSIS, ROUTINE W REFLEX MICROSCOPIC - Abnormal; Notable for the following components:   Color, Urine AMBER (*)    Bilirubin Urine MODERATE (*)    Protein, ur 30 (*)    All other components within normal limits  PROTIME-INR - Abnormal; Notable for the following components:   Prothrombin Time 16.3 (*)    INR 1.3 (*)    All other components within normal limits  LIPASE, BLOOD  AMMONIA  ETHANOL    EKG None  Radiology No results found.  Procedures Procedures    Medications Ordered in ED Medications  sodium chloride 0.9 % bolus 1,000 mL (has no administration in time range)  HYDROmorphone (DILAUDID) injection 1 mg (has no administration in time range)  ondansetron (ZOFRAN) injection 4 mg (has no administration in time range)  LORazepam (ATIVAN) injection 0-4 mg (has no administration in time range)    Or  LORazepam (ATIVAN) tablet 0-4 mg (has no administration in time  range)  LORazepam (ATIVAN) injection 0-4 mg (has no administration in time range)    Or  LORazepam (ATIVAN) tablet 0-4 mg (has no administration in time range)  thiamine (VITAMIN B1) tablet 100 mg (has no administration in time range)    Or  thiamine (VITAMIN B1) injection 100 mg (has no administration in time range)    ED Course/ Medical  Decision Making/ A&P                           Medical Decision Making Patient reports he last drank today at noon.  Patient reports the only symptoms he currently is having of withdrawal is agitation patient states that sometimes he becomes shaky  Amount and/or Complexity of Data Reviewed External Data Reviewed: notes.    Details: Hospitalist notes reviewed   GI notes reviewed Labs: ordered. Decision-making details documented in ED Course.    Details: Patient's bilirubin is elevated to 15.3.  Patient has elevated liver functions.  Patient's hemoglobin is 12.3 Radiology: ordered. Discussion of management or test interpretation with external provider(s): Hospitalist consulted for admission. Dr. Thomes Dinning requested that I obtain a CT scan of patient's abdomen and GI consult.  He wants to know what gastroenterology would plan for patient prior to admission. GI consulted.  Spoke with Dr. Levon Hedger GI on call he advised that he will see the patient in consult tomorrow he advised CIWA protocol and repeat chemistry panels with monitoring of bilirubin.  Risk OTC drugs. Prescription drug management. Risk Details: IV normal saline ordered patient given Dilaudid for pain CIWA protocol ordered.           Final Clinical Impression(s) / ED Diagnoses Final diagnoses:  Alcoholic cirrhosis, unspecified whether ascites present (HCC)    Rx / DC Orders ED Discharge Orders     None      Pt's care turned over to Dr. Bebe Shaggy who will consult hospitalist for admission after ct scan.   Elson Areas, PA-C 03/01/22 [id number]    Elson Areas,  PA-C 03/01/22 2351    Zadie Rhine, MD 03/02/22 [phone]

## 2022-03-02 ENCOUNTER — Encounter (HOSPITAL_COMMUNITY): Payer: Self-pay | Admitting: Internal Medicine

## 2022-03-02 ENCOUNTER — Inpatient Hospital Stay (HOSPITAL_COMMUNITY): Payer: BC Managed Care – PPO

## 2022-03-02 DIAGNOSIS — Z833 Family history of diabetes mellitus: Secondary | ICD-10-CM | POA: Diagnosis not present

## 2022-03-02 DIAGNOSIS — K7010 Alcoholic hepatitis without ascites: Secondary | ICD-10-CM | POA: Diagnosis present

## 2022-03-02 DIAGNOSIS — Y901 Blood alcohol level of 20-39 mg/100 ml: Secondary | ICD-10-CM | POA: Diagnosis present

## 2022-03-02 DIAGNOSIS — D649 Anemia, unspecified: Secondary | ICD-10-CM | POA: Diagnosis present

## 2022-03-02 DIAGNOSIS — Z79899 Other long term (current) drug therapy: Secondary | ICD-10-CM | POA: Diagnosis not present

## 2022-03-02 DIAGNOSIS — F1129 Opioid dependence with unspecified opioid-induced disorder: Secondary | ICD-10-CM | POA: Diagnosis not present

## 2022-03-02 DIAGNOSIS — Z888 Allergy status to other drugs, medicaments and biological substances status: Secondary | ICD-10-CM | POA: Diagnosis not present

## 2022-03-02 DIAGNOSIS — D696 Thrombocytopenia, unspecified: Secondary | ICD-10-CM | POA: Diagnosis not present

## 2022-03-02 DIAGNOSIS — K760 Fatty (change of) liver, not elsewhere classified: Secondary | ICD-10-CM | POA: Diagnosis present

## 2022-03-02 DIAGNOSIS — F10239 Alcohol dependence with withdrawal, unspecified: Secondary | ICD-10-CM | POA: Diagnosis present

## 2022-03-02 DIAGNOSIS — Z882 Allergy status to sulfonamides status: Secondary | ICD-10-CM | POA: Diagnosis not present

## 2022-03-02 DIAGNOSIS — R109 Unspecified abdominal pain: Secondary | ICD-10-CM | POA: Insufficient documentation

## 2022-03-02 DIAGNOSIS — E559 Vitamin D deficiency, unspecified: Secondary | ICD-10-CM | POA: Diagnosis present

## 2022-03-02 DIAGNOSIS — K7030 Alcoholic cirrhosis of liver without ascites: Secondary | ICD-10-CM | POA: Diagnosis present

## 2022-03-02 DIAGNOSIS — Z720 Tobacco use: Secondary | ICD-10-CM

## 2022-03-02 DIAGNOSIS — R162 Hepatomegaly with splenomegaly, not elsewhere classified: Secondary | ICD-10-CM | POA: Diagnosis present

## 2022-03-02 DIAGNOSIS — F1120 Opioid dependence, uncomplicated: Secondary | ICD-10-CM | POA: Diagnosis present

## 2022-03-02 DIAGNOSIS — F1010 Alcohol abuse, uncomplicated: Secondary | ICD-10-CM

## 2022-03-02 DIAGNOSIS — D6959 Other secondary thrombocytopenia: Secondary | ICD-10-CM | POA: Diagnosis present

## 2022-03-02 DIAGNOSIS — Z83719 Family history of colon polyps, unspecified: Secondary | ICD-10-CM | POA: Diagnosis not present

## 2022-03-02 DIAGNOSIS — F32A Depression, unspecified: Secondary | ICD-10-CM | POA: Diagnosis present

## 2022-03-02 DIAGNOSIS — R7401 Elevation of levels of liver transaminase levels: Secondary | ICD-10-CM | POA: Diagnosis not present

## 2022-03-02 DIAGNOSIS — F17210 Nicotine dependence, cigarettes, uncomplicated: Secondary | ICD-10-CM | POA: Diagnosis present

## 2022-03-02 DIAGNOSIS — Z820 Family history of epilepsy and other diseases of the nervous system: Secondary | ICD-10-CM | POA: Diagnosis not present

## 2022-03-02 DIAGNOSIS — K7011 Alcoholic hepatitis with ascites: Secondary | ICD-10-CM | POA: Diagnosis not present

## 2022-03-02 DIAGNOSIS — K219 Gastro-esophageal reflux disease without esophagitis: Secondary | ICD-10-CM | POA: Diagnosis present

## 2022-03-02 DIAGNOSIS — F10229 Alcohol dependence with intoxication, unspecified: Secondary | ICD-10-CM | POA: Diagnosis present

## 2022-03-02 DIAGNOSIS — Z8249 Family history of ischemic heart disease and other diseases of the circulatory system: Secondary | ICD-10-CM | POA: Diagnosis not present

## 2022-03-02 DIAGNOSIS — Z818 Family history of other mental and behavioral disorders: Secondary | ICD-10-CM | POA: Diagnosis not present

## 2022-03-02 DIAGNOSIS — F419 Anxiety disorder, unspecified: Secondary | ICD-10-CM | POA: Diagnosis present

## 2022-03-02 DIAGNOSIS — I10 Essential (primary) hypertension: Secondary | ICD-10-CM | POA: Diagnosis present

## 2022-03-02 LAB — COMPREHENSIVE METABOLIC PANEL
ALT: 69 U/L — ABNORMAL HIGH (ref 0–44)
AST: 225 U/L — ABNORMAL HIGH (ref 15–41)
Albumin: 2.1 g/dL — ABNORMAL LOW (ref 3.5–5.0)
Alkaline Phosphatase: 151 U/L — ABNORMAL HIGH (ref 38–126)
Anion gap: 8 (ref 5–15)
BUN: <5 mg/dL — ABNORMAL LOW (ref 6–20)
CO2: 28 mmol/L (ref 22–32)
Calcium: 8 mg/dL — ABNORMAL LOW (ref 8.9–10.3)
Chloride: 101 mmol/L (ref 98–111)
Creatinine, Ser: 0.39 mg/dL — ABNORMAL LOW (ref 0.61–1.24)
GFR, Estimated: >60 mL/min (ref >60)
Glucose, Bld: 87 mg/dL (ref 70–99)
Potassium: 3.9 mmol/L (ref 3.5–5.1)
Sodium: 137 mmol/L (ref 135–145)
Total Bilirubin: 15.7 mg/dL — ABNORMAL HIGH (ref 0.3–1.2)
Total Protein: 6.1 g/dL — ABNORMAL LOW (ref 6.5–8.1)

## 2022-03-02 LAB — CBC
HCT: 32.8 % — ABNORMAL LOW (ref 39.0–52.0)
Hemoglobin: 11.2 g/dL — ABNORMAL LOW (ref 13.0–17.0)
MCH: 30.9 pg (ref 26.0–34.0)
MCHC: 34.1 g/dL (ref 30.0–36.0)
MCV: 90.6 fL (ref 80.0–100.0)
Platelets: 98 10*3/uL — ABNORMAL LOW (ref 150–400)
RBC: 3.62 MIL/uL — ABNORMAL LOW (ref 4.22–5.81)
RDW: 20 % — ABNORMAL HIGH (ref 11.5–15.5)
WBC: 4.5 10*3/uL (ref 4.0–10.5)
nRBC: 0 % (ref 0.0–0.2)

## 2022-03-02 LAB — BILIRUBIN, DIRECT: Bilirubin, Direct: 8.8 mg/dL — ABNORMAL HIGH (ref 0.0–0.2)

## 2022-03-02 LAB — PROTIME-INR
INR: 1.5 — ABNORMAL HIGH (ref 0.8–1.2)
Prothrombin Time: 17.6 seconds — ABNORMAL HIGH (ref 11.4–15.2)

## 2022-03-02 LAB — PHOSPHORUS: Phosphorus: 3.4 mg/dL (ref 2.5–4.6)

## 2022-03-02 LAB — MAGNESIUM: Magnesium: 1.3 mg/dL — ABNORMAL LOW (ref 1.7–2.4)

## 2022-03-02 MED ORDER — BUSPIRONE HCL 5 MG PO TABS
10.0000 mg | ORAL_TABLET | Freq: Three times a day (TID) | ORAL | Status: DC
  Administered 2022-03-02 – 2022-03-05 (×9): 10 mg via ORAL
  Filled 2022-03-02 (×9): qty 2

## 2022-03-02 MED ORDER — PANTOPRAZOLE SODIUM 40 MG PO TBEC
40.0000 mg | DELAYED_RELEASE_TABLET | Freq: Two times a day (BID) | ORAL | Status: DC
  Administered 2022-03-02 – 2022-03-05 (×6): 40 mg via ORAL
  Filled 2022-03-02 (×6): qty 1

## 2022-03-02 MED ORDER — HYDROMORPHONE HCL 1 MG/ML IJ SOLN
0.5000 mg | INTRAMUSCULAR | Status: DC | PRN
  Administered 2022-03-02 – 2022-03-03 (×7): 0.5 mg via INTRAVENOUS
  Filled 2022-03-02 (×7): qty 0.5

## 2022-03-02 MED ORDER — FENTANYL CITRATE PF 50 MCG/ML IJ SOSY
50.0000 ug | PREFILLED_SYRINGE | Freq: Once | INTRAMUSCULAR | Status: AC
  Administered 2022-03-02: 50 ug via INTRAVENOUS
  Filled 2022-03-02: qty 1

## 2022-03-02 MED ORDER — GABAPENTIN 300 MG PO CAPS
300.0000 mg | ORAL_CAPSULE | Freq: Three times a day (TID) | ORAL | Status: DC
  Administered 2022-03-02 – 2022-03-05 (×9): 300 mg via ORAL
  Filled 2022-03-02 (×9): qty 1

## 2022-03-02 MED ORDER — ONDANSETRON HCL 4 MG PO TABS: 4.0000 mg | ORAL_TABLET | Freq: Four times a day (QID) | ORAL | Status: DC | PRN

## 2022-03-02 MED ORDER — SODIUM CHLORIDE 0.9 % IV SOLN: INTRAVENOUS | Status: AC

## 2022-03-02 MED ORDER — QUETIAPINE FUMARATE 25 MG PO TABS
25.0000 mg | ORAL_TABLET | Freq: Every day | ORAL | Status: DC
  Administered 2022-03-02 – 2022-03-04 (×3): 25 mg via ORAL
  Filled 2022-03-02 (×3): qty 1

## 2022-03-02 MED ORDER — PREDNISOLONE 5 MG PO TABS
40.0000 mg | ORAL_TABLET | Freq: Every day | ORAL | Status: DC
  Administered 2022-03-02 – 2022-03-05 (×4): 40 mg via ORAL
  Filled 2022-03-02 (×5): qty 8

## 2022-03-02 MED ORDER — HEPARIN SODIUM (PORCINE) 5000 UNIT/ML IJ SOLN
5000.0000 [IU] | Freq: Three times a day (TID) | INTRAMUSCULAR | Status: DC
  Administered 2022-03-02 – 2022-03-05 (×8): 5000 [IU] via SUBCUTANEOUS
  Filled 2022-03-02 (×7): qty 1

## 2022-03-02 MED ORDER — ONDANSETRON HCL 4 MG/2ML IJ SOLN: 4.0000 mg | Freq: Four times a day (QID) | INTRAMUSCULAR | Status: DC | PRN

## 2022-03-02 MED ORDER — HEPARIN SODIUM (PORCINE) 5000 UNIT/ML IJ SOLN
5000.0000 [IU] | Freq: Three times a day (TID) | INTRAMUSCULAR | Status: DC
  Filled 2022-03-02: qty 1

## 2022-03-02 NOTE — TOC Progression Note (Signed)
Transition of Care [facility]) - Progression Note    Patient Details  Name: Jake King MRN: [id number] Date of Birth: 12-10-1990  Transition of Care [facility]) CM/SW Contact  Karn Cassis, [place] Phone Number: 03/02/2022, 10:36 AM  Clinical Narrative:   Transition of Care [facility]) Screening Note   Patient Details  Name: Jake King Date of Birth: 1990-09-24   Transition of Care [facility]) CM/SW Contact:    Karn Cassis, LCSW Phone Number: 03/02/2022, 10:36 AM    Transition of Care Department [facility]) has reviewed patient and no TOC needs have been identified at this time. We will continue to monitor patient advancement through interdisciplinary progression rounds. If new patient transition needs arise, please place a TOC consult.         Barriers to Discharge: Continued Medical Work up  Expected Discharge Plan and Services                                                 Social Determinants of Health (SDOH) Interventions    Readmission Risk Interventions     No data to display

## 2022-03-02 NOTE — Plan of Care (Signed)
  Problem: Health Behavior/Discharge Planning: Goal: Ability to manage health-related needs will improve Outcome: Progressing   

## 2022-03-02 NOTE — TOC Progression Note (Signed)
Transition of Care [facility]) - Progression Note    Patient Details  Name: Jake King MRN: [id number] Date of Birth: [birth year]  Transition of Care [facility]) CM/SW Contact  Karn Cassis, [place] Phone Number: 03/02/2022, 1:29 PM  Clinical Narrative: LCSW received consult for substance abuse treatment. Discussed with pt who states he is already seeing Dr. Sherre Lain in [place] for addiction counseling. Pt reports no other resource needs. He plans to follow up with Dr. Cathey Endow after d/c.         Barriers to Discharge: Continued Medical Work up  Expected Discharge Plan and Services                                                 Social Determinants of Health (SDOH) Interventions    Readmission Risk Interventions     No data to display

## 2022-03-02 NOTE — H&P (Addendum)
History and Physical    Patient: Jake King [id number] DOB: October 18, 1990 DOA: 03/01/2022 DOS: the patient was seen and examined on 03/02/2022 PCP: Anabel Halon, MD  Patient coming from: Home  Chief Complaint:  Chief Complaint  Patient presents with   Abdominal Pain   HPI: Jake King is a 31 y.o. male with medical history significant of hypertension, cirrhosis, alcoholic hepatitis, GERD, alcohol abuse, tobacco abuse who presents to the emergency department due to 2-week onset of abdominal pain and jaundice.  He states that he already quit drinking, but started drinking again about 3 weeks ago and he has had difficulty in being able to keep anything down since last 3 days.  He was worried that the skin started on a yellow, so he presents to the ED for further evaluation and management.  ED Course:  In the emergency department, he was hemodynamically stable.  Workup in the ED showed normocytic anemia and thrombocytopenia.  BMP was normal except for BUN less than 5 and creatinine 0.36.  AST 254, ALT 76, ALP 173, total bilirubin 15.3.  Urinalysis was unimpressive for UTI, alcohol level was 29, lipase 28.  Ammonia 30. CT abdomen and pelvis with contrast showed early cirrhosis with hepatic steatosis and hepatosplenomegaly. Small volume pelvic ascites. Portal vein is patent. Wall thickening involving the right colon, likely related to venous congestion given the additional findings. Infectious/inflammatory colitis is considered unlikely. IV fentanyl and Dilaudid were given, patient was started on CIWA protocol.  Zofran was given. Gastroenterologist (Dr. Levon Hedger) on call was consulted and recommended starting CIWA protocol and to repeat chemistry panels with monitoring of bilirubin and will plan to see patient in the morning.  Hospitalist was asked to admit patient for further evaluation and management.    Review of Systems: Review of systems as noted in the HPI. All  other systems reviewed and are negative.   Past Medical History:  Diagnosis Date   Brain mass    pineal cyst   Cirrhosis (HCC)    Fatty liver    Hepatitis    Hypertension    Migraine    Right knee injury    Seizures (HCC)    Vitamin D deficiency    Past Surgical History:  Procedure Laterality Date   BIOPSY  06/10/2021   Procedure: BIOPSY;  Surgeon: Lanelle Bal, DO;  Location: [facility];  Service: Endoscopy;;   ESOPHAGOGASTRODUODENOSCOPY (EGD) WITH PROPOFOL N/A 06/10/2021   Procedure: ESOPHAGOGASTRODUODENOSCOPY (EGD) WITH PROPOFOL;  Surgeon: Lanelle Bal, DO;  Location: [facility];  Service: Endoscopy;  Laterality: N/A;  11:15am, ASA 2   NASAL SINUS SURGERY     NASAL SINUS SURGERY  2009    Social History:  reports that he has been smoking cigarettes. He has been smoking an average of .5 packs per day. He has been exposed to tobacco smoke. He has quit using smokeless tobacco. He reports that he does not currently use alcohol after a past usage of about 56.0 - 70.0 standard drinks of alcohol per week. He reports that he does not currently use drugs.   Allergies  Allergen Reactions   Atropine Swelling    Eye Drops from when he was a baby   Other Swelling    SULFITES IN ALCOHOLS     Family History  Problem Relation Age of Onset   Other Mother        "Twisted Colon"   Mental illness Mother    Diabetes Father  Hypertension Father    Pneumonia Father    Liver disease Father        unsure what   Ulcerative colitis Paternal Grandmother    Colon polyps Paternal Grandmother    Congestive Heart Failure Paternal Grandfather    Hypertension Paternal Grandfather    Diabetes Paternal Grandfather    Multiple sclerosis Paternal Grandfather    Colon cancer Neg Hx      Prior to Admission medications   Medication Sig Start Date End Date Taking? Authorizing Provider  albuterol (VENTOLIN HFA) 108 (90 Base) MCG/ACT inhaler Inhale 2 puffs into the lungs every 4  (four) hours as needed for wheezing or shortness of breath. 05/01/21   Lindell Spar, MD  benzonatate (TESSALON) 100 MG capsule Take 1 capsule (100 mg total) by mouth 2 (two) times daily as needed for cough. 11/21/21   Paseda, Dewaine Conger, FNP  chlordiazePOXIDE (LIBRIUM) 5 MG capsule Take 2 tablets by mouth twice a day x1 day; then 1 tablet by mouth 3 times a day x1 day; then 1 tablet by mouth twice a day x2 days; then 1 tablet by mouth daily x3 days and stop Librium. Patient not taking: Reported on 11/21/2021 10/22/21   Eloise Harman, DO  Cholecalciferol (VITAMIN D) 50 MCG (2000 UT) CAPS Take 2,000 Units by mouth in the morning.    [provider]  cycloSPORINE (RESTASIS) 0.05 % ophthalmic emulsion Place 1 drop into both eyes 2 (two) times daily. Patient not taking: Reported on 11/21/2021    [provider]  folic acid (FOLVITE) 1 MG tablet Take 1 tablet (1 mg total) by mouth daily. 07/13/21   Barton Dubois, MD  Multiple Vitamins-Minerals (MULTIVITAMIN WITH MINERALS) tablet Take 1 tablet by mouth in the morning.    [provider]  nicotine (NICODERM CQ - DOSED IN MG/24 HOURS) 14 mg/24hr patch Place 1 patch (14 mg total) onto the skin daily. 10/19/21   Barton Dubois, MD  pantoprazole (PROTONIX) 40 MG tablet Take 1 tablet (40 mg total) by mouth 2 (two) times daily. Take 30 minutes before breakfast 10/22/21 10/22/22  Eloise Harman, DO  spironolactone (ALDACTONE) 25 MG tablet Take 1 tablet (25 mg total) by mouth daily. Patient not taking: Reported on 11/21/2021 10/19/21   Barton Dubois, MD  thiamine (VITAMIN B1) 100 MG tablet Take 1 tablet (100 mg total) by mouth daily. 10/19/21   Barton Dubois, MD  traZODone (DESYREL) 50 MG tablet Take 1 tablet (50 mg total) by mouth at bedtime as needed for sleep. 07/15/21   Alvira Monday, FNP    Physical Exam: BP 128/77   Pulse 94   Temp 98.5 F (36.9 C) (Oral)   Resp 19   Ht 5' 10.5" (1.791 m)   Wt 86.2 kg   SpO2 94%   BMI 26.88 kg/m    General: 31 y.o. year-old male well developed well nourished in no acute distress.  Alert and oriented x3. HEENT: NCAT, EOMI, scleral icterus Neck: Supple, trachea medial Cardiovascular: Regular rate and rhythm with no rubs or gallops.  No thyromegaly or JVD noted.  No lower extremity edema. 2/4 pulses in all 4 extremities. Respiratory: Clear to auscultation with no wheezes or rales. Good inspiratory effort. Abdomen: Soft, tender to palpation of abdomen without guarding.  Normal bowel sounds x4 quadrants. Muskuloskeletal: No cyanosis, clubbing or edema noted bilaterally Neuro: CN II-XII intact, strength 5/5 x 4, sensation, reflexes intact Skin: No ulcerative lesions noted or rashes Psychiatry: Judgement and insight  appear normal. Mood is appropriate for condition and setting          Labs on Admission:  Basic Metabolic Panel: Recent Labs  Lab 03/01/22 1935  NA 137  K 4.3  CL 102  CO2 28  GLUCOSE 98  BUN <5*  CREATININE 0.36*  CALCIUM 8.1*   Liver Function Tests: Recent Labs  Lab 03/01/22 1935  AST 254*  ALT 76*  ALKPHOS 173*  BILITOT 15.3*  PROT 6.9  ALBUMIN 2.4*   Recent Labs  Lab 03/01/22 1935  LIPASE 28   Recent Labs  Lab 03/01/22 1932  AMMONIA 30   CBC: Recent Labs  Lab 03/01/22 1935  WBC 4.2  HGB 12.3*  HCT 36.6*  MCV 90.4  PLT 108*   Cardiac Enzymes: No results for input(s): "CKTOTAL", "CKMB", "CKMBINDEX", "TROPONINI" in the last 168 hours.  BNP (last 3 results) No results for input(s): "BNP" in the last 8760 hours.  ProBNP (last 3 results) No results for input(s): "PROBNP" in the last 8760 hours.  CBG: No results for input(s): "GLUCAP" in the last 168 hours.  Radiological Exams on Admission: CT ABDOMEN PELVIS W CONTRAST  Result Date: 03/02/2022 CLINICAL DATA:  Abdominal pain, jaundice EXAM: CT ABDOMEN AND PELVIS WITH CONTRAST TECHNIQUE: Multidetector CT imaging of the abdomen and pelvis was performed using the standard protocol  following bolus administration of intravenous contrast. RADIATION DOSE REDUCTION: This exam was performed according to the departmental dose-optimization program which includes automated exposure control, adjustment of the mA and/or kV according to patient size and/or use of iterative reconstruction technique. CONTRAST:  131mL OMNIPAQUE IOHEXOL 300 MG/ML  SOLN COMPARISON:  10/17/2021 FINDINGS: Lower chest: Lung bases are clear. Hepatobiliary: Mildly nodular hepatic contour, suggesting early cirrhosis. Hepatic steatosis with hepatomegaly. Gallbladder is unremarkable. No intrahepatic or extrahepatic duct dilatation. Pancreas: Mild parenchymal atrophy. Spleen: Enlarged, measuring 15.4 cm in maximal craniocaudal dimension. Adrenals/Urinary Tract: Adrenal glands are within normal limits. Kidneys are within normal limits.  No hydronephrosis. Bladder is within normal limits. Stomach/Bowel: Stomach is within normal limits. No evidence of bowel obstruction. Normal appendix (series 2/image 63). Wall thickening involving the right colon, likely related to venous congestion given the additional findings. Infectious/inflammatory colitis is considered unlikely. Vascular/Lymphatic: No evidence of abdominal aortic aneurysm. Portal vein is patent. Mildly prominent upper abdominal lymph nodes, including an 11 mm short axis gastrohepatic node (series 2/image 28), likely reactive. Reproductive: Prostate is unremarkable. Other: Small volume pelvic ascites. No free air. Musculoskeletal: Visualized osseous structures are within normal limits. IMPRESSION: Early cirrhosis with hepatic steatosis and hepatosplenomegaly. Small volume pelvic ascites. Portal vein is patent. Wall thickening involving the right colon, likely related to venous congestion given the additional findings. Infectious/inflammatory colitis is considered unlikely. Additional ancillary findings as above. Electronically Signed   By: Julian Hy M.D.   On: 03/02/2022  00:24    EKG: I independently viewed the EKG done and my findings are as followed: EKG was not done in the ED  Assessment/Plan Present on Admission:  Intractable abdominal pain  Hyperbilirubinemia  Thrombocytopenia (HCC)  Alcohol abuse  Tobacco abuse  Principal Problem:   Intractable abdominal pain Active Problems:   Tobacco abuse   Thrombocytopenia (HCC)   Hyperbilirubinemia   Alcohol abuse  Intractable abdominal pain  Continue IV NS at 100 mLs/Hr Continue IV Dilaudid 0.5 mg q.3h p.r.n. for moderate to severe pain Continue IV Zofran p.r.n. Continue clear liquid diet with plan to advanced diet as tolerated Gastroenterologist was consulted to follow up  with patient in the morning per ED PA  Transaminitis and hyperbilirubinemia possibly due to patient's history of alcoholic cirrhosis AST 254, ALT 76, ALP 173, total bilirubin 15.3 RUQ ultrasound to be done in the morning Hepatitis panel will be done Direct bilirubin will be checked Gastroenterologist was consulted and will follow-up with patient in the morning.  Thrombocytopenia possibly due to cirrhosis Platelets 108, continue to monitor platelet levels  Alcohol withdrawal/Abuse Alcohol level was 29 Continue CIWA protocol, thiamine and multivitamin Continue fall precaution  Patient was counseled on alcohol withdrawal cessation   Tobacco abuse Patient was counseled on tobacco abuse cessation   DVT prophylaxis: Heparin subcu  Code Status: Full code  Family Communication: None at bedside  Consults: Gastroenterology (by ED team)   Severity of Illness: The appropriate patient status for this patient is INPATIENT. Inpatient status is judged to be reasonable and necessary in order to provide the required intensity of service to ensure the patient's safety. The patient's presenting symptoms, physical exam findings, and initial radiographic and laboratory data in the context of their chronic comorbidities is felt to  place them at high risk for further clinical deterioration. Furthermore, it is not anticipated that the patient will be medically stable for discharge from the hospital within 2 midnights of admission.   * I certify that at the point of admission it is my clinical judgment that the patient will require inpatient hospital care spanning beyond 2 midnights from the point of admission due to high intensity of service, high risk for further deterioration and high frequency of surveillance required.*  Author: Frankey Shown, DO 03/02/2022 2:49 AM  For on call review [email or web address].

## 2022-03-02 NOTE — ED Notes (Signed)
Report given to Alicia,RN. Wardell Heath Gerrianne Scale

## 2022-03-02 NOTE — ED Notes (Signed)
Pt ambulated to restroom with steady gait. Caly Pellum Jeury Mcnab,RN 

## 2022-03-02 NOTE — Progress Notes (Signed)
PROGRESS NOTE    Jake King  XLK:[id number] DOB: 1990/06/14 DOA: 03/01/2022 PCP: Anabel Halon, MD    Brief Narrative:   Jake King is a 31 y.o. male with past medical history significant for HTN, EtOH cirrhosis/hepatitis, GERD, continued alcohol use disorder, tobacco use who presented to [facility] ED on 12/17 with 2-week history of progressive abdominal pain and jaundice.  Patient reports started drinking alcohol again roughly 3 weeks ago and has reported difficulty keeping anything down over the past 3 days.  Also concerned that his skin started to change to a yellow color which prompted him to seek further evaluation in the ED.  In the emergency department, temperature 99.1 F, HR 94, RR 18, BP 140/93, SpO2 98% on room air.  WBC 4.2, hemoglobin 12.3, platelets 108.  Sodium 137, potassium 4.3, chloride 102, CO2 28, glucose 98, BUN less than 5, creatinine 0.36.  Alkaline phosphatase 173.  AST 254, ALT 76, total bilirubin 15.3.  INR 1.3.  EtOH level 29.  Ammonia level 30.  CT abdomen/pelvis with early cirrhosis, hepatic steatosis and hepatosplenomegaly, small volume pelvic ascites, portal vein patent, wall thickening involving the right colon likely related to venous congestion, infectious/inflammatory colitis considered unlikely.  GI (Dr. Levon Hedger) was consulted.  Patient was given IV Zofran, fentanyl, Dilaudid and patient was started on CIWA protocol.  TRH consulted for admission for further evaluation management of acute decompensated EtOH cirrhosis/hepatitis, acute alcohol intoxication.  Assessment & Plan:   Acute decompensated EtOH cirrhosis/hepatitis Acute EtOH intoxication Patient presenting to ED with 3-week history of progressive abdominal pain and discoloration of skin.  Patient reports during this timeframe he restarted alcohol use.  LFTs on admission notable for AST 254, ALT 76, total bilirubin 15.3.  CT Abdo/pelvis with early cirrhosis, hepatic steatosis and  hepatosplenomegaly; small volume pelvic ascites.  Ammonia level 30, EtOH level 29 on admission. . -- GI following; appreciate assistance Admission MELD-Na = 21 -- MDF = 20.4 -- AST 254>225 -- ALT 76>69 -- Tbili 15.3>15.7 --Avoid hepatotoxins -- CMP, PT/INR daily -- Discussed alcohol cessation  Acute EtOH intoxication EtOH level 29 on admission. -- gabapentin 300mg  PO TID -- CIWA protocol with symptom triggered Ativan -- TOC consult  Anxiety/depression -- BuSpar 10 mg p.o. 3 times daily  GERD: Protonix 40 mg p.o. twice daily  DVT prophylaxis: heparin injection 5,000 Units Start: 03/02/22 0600 SCDs Start: 03/02/22 0451    Code Status: Full Code Family Communication: No family present at bedside this morning  Disposition Plan:  Level of care: Med-Surg Status is: Inpatient Remains inpatient appropriate because: Close monitoring for EtOH withdrawal    Consultants:  Gastroenterology  Procedures:  None  Antimicrobials:  None   Subjective: Patient seen examined bedside, resting comfortably.  Lying in bed.  Continues with mild abdominal discomfort.  No other specific questions or concerns at this time.  Discussed need for complete alcohol cessation.  Denies headache, no dizziness, no chest pain, no shortness of breath, no fever/chills/night sweats, no nausea/vomiting/diarrhea, no focal weakness, no fatigue, no paresthesias.  No acute events overnight per nursing staff.  Objective: Vitals:   03/02/22 0300 03/02/22 0400 03/02/22 0540 03/02/22 1007  BP: 123/79 119/77 128/78 119/75  Pulse: 84 91 82 96  Resp: 10 20 19 18   Temp:  98.1 F (36.7 C) 100.1 F (37.8 C) 98.3 F (36.8 C)  TempSrc:   Oral Oral  SpO2: 94% 96% 99% 94%  Weight:   87.3 kg   Height:  Intake/Output Summary (Last 24 hours) at 03/02/2022 1224 Last data filed at 03/02/2022 0154 Gross per 24 hour  Intake 1000 ml  Output --  Net 1000 ml   Filed Weights   03/01/22 1829 03/02/22 0540   Weight: 86.2 kg 87.3 kg    Examination:  Physical Exam: GEN: NAD, alert and oriented x 3, chronically ill in appearance, appears older than stated age, notably jaundiced HEENT: NCAT, PERRL, EOMI, scleral icterus, MMM PULM: CTAB w/o wheezes/crackles, normal respiratory effort, on room air CV: RRR w/o M/G/R GI: abd soft, NTND, NABS, no R/G/M MSK: no peripheral edema, moves all extremities independently NEURO: CN II-XII intact, no focal deficits, sensation to light touch intact PSYCH: normal mood/affect Integumentary: diffuse jaundice, no other rashes/lesions/wounds noted on exposed skin surfaces    Data Reviewed: I have personally reviewed following labs and imaging studies  CBC: Recent Labs  Lab 03/01/22 1935 03/02/22 0801  WBC 4.2 4.5  HGB 12.3* 11.2*  HCT 36.6* 32.8*  MCV 90.4 90.6  PLT 108* 98*   Basic Metabolic Panel: Recent Labs  Lab 03/01/22 1935 03/02/22 0801  NA 137 137  K 4.3 3.9  CL 102 101  CO2 28 28  GLUCOSE 98 87  BUN <5* <5*  CREATININE 0.36* 0.39*  CALCIUM 8.1* 8.0*  MG  --  1.3*  PHOS  --  3.4   GFR: Estimated Creatinine Clearance: 143 mL/min (A) (by C-G formula based on SCr of 0.39 mg/dL (L)). Liver Function Tests: Recent Labs  Lab 03/01/22 1935 03/02/22 0801  AST 254* 225*  ALT 76* 69*  ALKPHOS 173* 151*  BILITOT 15.3* 15.7*  PROT 6.9 6.1*  ALBUMIN 2.4* 2.1*   Recent Labs  Lab 03/01/22 1935  LIPASE 28   Recent Labs  Lab 03/01/22 1932  AMMONIA 30   Coagulation Profile: Recent Labs  Lab 03/01/22 2023  INR 1.3*   Cardiac Enzymes: No results for input(s): "CKTOTAL", "CKMB", "CKMBINDEX", "TROPONINI" in the last 168 hours. BNP (last 3 results) No results for input(s): "PROBNP" in the last 8760 hours. HbA1C: No results for input(s): "HGBA1C" in the last 72 hours. CBG: No results for input(s): "GLUCAP" in the last 168 hours. Lipid Profile: No results for input(s): "CHOL", "HDL", "LDLCALC", "TRIG", "CHOLHDL", "LDLDIRECT"  in the last 72 hours. Thyroid Function Tests: No results for input(s): "TSH", "T4TOTAL", "FREET4", "T3FREE", "THYROIDAB" in the last 72 hours. Anemia Panel: No results for input(s): "VITAMINB12", "FOLATE", "FERRITIN", "TIBC", "IRON", "RETICCTPCT" in the last 72 hours. Sepsis Labs: No results for input(s): "PROCALCITON", "LATICACIDVEN" in the last 168 hours.  No results found for this or any previous visit (from the past 240 hour(s)).       Radiology Studies: US Abdomen Limited  Result Date: 03/02/2022 CLINICAL DATA:  Mid abdominal pain. EXAM: ULTRASOUND ABDOMEN LIMITED RIGHT UPPER QUADRANT COMPARISON:  CT abdomen and pelvis 03/02/2022 FINDINGS: Gallbladder: No gallstones. Gallbladder wall thickness of 7 mm. No sonographic Murphy sign noted by sonographer. Common bile duct: Diameter: 5 mm Liver: Heterogeneously increased parenchymal echogenicity diffusely with nodular liver contour. No focal liver lesion identified. Portal vein is patent on color Doppler imaging with normal direction of blood flow towards the liver. Other: None. IMPRESSION: 1. Cirrhosis. 2. Gallbladder wall thickening, likely secondary to chronic liver disease. No gallstones or other findings strongly suggestive of acute cholecystitis. Electronically Signed   By: Sebastian Ache M.D.   On: 03/02/2022 08:16   CT ABDOMEN PELVIS W CONTRAST  Result Date: 03/02/2022 CLINICAL DATA:  Abdominal  pain, jaundice EXAM: CT ABDOMEN AND PELVIS WITH CONTRAST TECHNIQUE: Multidetector CT imaging of the abdomen and pelvis was performed using the standard protocol following bolus administration of intravenous contrast. RADIATION DOSE REDUCTION: This exam was performed according to the departmental dose-optimization program which includes automated exposure control, adjustment of the mA and/or kV according to patient size and/or use of iterative reconstruction technique. CONTRAST:  OMNIPAQUE IOHEXOL 300 MG/ML  SOLN COMPARISON:  10/17/2021  FINDINGS: Lower chest: Lung bases are clear. Hepatobiliary: Mildly nodular hepatic contour, suggesting early cirrhosis. Hepatic steatosis with hepatomegaly. Gallbladder is unremarkable. No intrahepatic or extrahepatic duct dilatation. Pancreas: Mild parenchymal atrophy. Spleen: Enlarged, measuring 15.4 cm in maximal craniocaudal dimension. Adrenals/Urinary Tract: Adrenal glands are within normal limits. Kidneys are within normal limits.  No hydronephrosis. Bladder is within normal limits. Stomach/Bowel: Stomach is within normal limits. No evidence of bowel obstruction. Normal appendix (series 2/image 63). Wall thickening involving the right colon, likely related to venous congestion given the additional findings. Infectious/inflammatory colitis is considered unlikely. Vascular/Lymphatic: No evidence of abdominal aortic aneurysm. Portal vein is patent. Mildly prominent upper abdominal lymph nodes, including an 11 mm short axis gastrohepatic node (series 2/image 28), likely reactive. Reproductive: Prostate is unremarkable. Other: Small volume pelvic ascites. No free air. Musculoskeletal: Visualized osseous structures are within normal limits. IMPRESSION: Early cirrhosis with hepatic steatosis and hepatosplenomegaly. Small volume pelvic ascites. Portal vein is patent. Wall thickening involving the right colon, likely related to venous congestion given the additional findings. Infectious/inflammatory colitis is considered unlikely. Additional ancillary findings as above. Electronically Signed   By: Charline Bills M.D.   On: 03/02/2022 00:24        Scheduled Meds:  heparin injection (subcutaneous)  5,000 Units Subcutaneous Q8H   LORazepam  0-4 mg Intravenous Q6H   Or   LORazepam  0-4 mg Oral Q6H   [START ON 03/04/2022] LORazepam  0-4 mg Intravenous Q12H   Or   [START ON 03/04/2022] LORazepam  0-4 mg Oral Q12H   thiamine  100 mg Oral Daily   Or   thiamine  100 mg Intravenous Daily   Continuous  Infusions:   LOS: 0 days    Time spent: 51 minutes spent on chart review, discussion with nursing staff, consultants, updating family and interview/physical exam; more than 50% of that time was spent in counseling and/or coordination of care.    Alvira Philips [place], DO [facility] Available via Epic secure chat 7am-7pm After these hours, please refer to coverage provider listed on [email or web address] 03/02/2022, 12:24 PM

## 2022-03-02 NOTE — Consult Note (Signed)
Gastroenterology Consult   Referring Provider: Dr. Josephine Cables  Primary Care Physician:  Lindell Spar, MD Primary Gastroenterologist:  Dr. Abbey Chatters  Patient ID: Jake King; [id number]; May 07, 1990   Admit date: 03/01/2022  LOS: 0 days   Date of Consultation: 03/02/2022  Reason for Consultation:  Elevated LFTs, history of alcoholic cirrhosis   History of Present Illness   Jake King is a 31 y.o. year old male with history of alcoholic hepatitis, chronic alcohol abuse, cirrhosis with full prior serological evaluation on file including negative Hep C and B, last inpatient in Aug 1017 with alcoholic hepatitis, presenting now again with abdominal pain and jaundice in setting of recurrent ETOH intake starting around Thanksgiving. Admitted with acute alcoholic hepatitis with GI consult requested.  In ED: Tbili 15.3, AST 254, ALT 76, Alk Phos 173, creatinine 0.36, Hgb 12.3, platelets 108, no leukocytosis, lipase normal, INR 1.3, ethanol level 29. CT abd/pelvis with contrast with early cirrhosis, hepatic steatosis, hepatosplenomegaly. Small volume pelvic ascites, patent portal vein, wall thickening of right colon likely related to congestion. RUQ [place] with cirrhosis, gallbladder wall thickening likely secondary to chronic liver disease. No gallstones.   Today, Tbili slightly increased to 15.7. Transaminases slightly improved, alk phos improved. Repeat INR is pending.   Patient resumed drinking alcohol around Thanksgiving. Reporting 12-15 beers per day. Lats drink yesterday at noon. Noted abdominal pain in RUQ and LUQ, associated nausea and vomiting. Felt cold but no fever. No NSAIDs. Worsening over last few weeks. Intermittent diarrhea over last few weeks but none since admitted. Vomiting prior to admission but none now. No mental status changes or confusion. No overt GI bleeding.    Past Medical History:  Diagnosis Date   Brain mass    pineal cyst   Cirrhosis ([place])    Fatty  liver    Hepatitis    Hypertension    Migraine    Right knee injury    Seizures ([place])    Vitamin D deficiency     Past Surgical History:  Procedure Laterality Date   BIOPSY  06/10/2021   Procedure: BIOPSY;  Surgeon: Eloise Harman, DO;  Location: [facility];  Service: Endoscopy;;   ESOPHAGOGASTRODUODENOSCOPY (EGD) WITH PROPOFOL N/A 06/10/2021   Procedure: ESOPHAGOGASTRODUODENOSCOPY (EGD) WITH PROPOFOL;  Surgeon: Eloise Harman, DO;  Location: [facility];  Service: Endoscopy;  Laterality: N/A;  11:15am, ASA 2   NASAL SINUS SURGERY     NASAL SINUS SURGERY  2009    Prior to Admission medications   Medication Sig Start Date End Date Taking? Authorizing Provider  acamprosate (CAMPRAL) 333 MG tablet Take 666 mg by mouth 3 (three) times daily. 01/19/22  Yes [provider]  albuterol (VENTOLIN HFA) 108 (90 Base) MCG/ACT inhaler Inhale 2 puffs into the lungs every 4 (four) hours as needed for wheezing or shortness of breath. 05/01/21  Yes Lindell Spar, MD  Buprenorphine HCl-Naloxone HCl 8-2 MG FILM Place 0.25 Film under the tongue daily. 01/01/22  Yes [provider]  busPIRone (BUSPAR) 10 MG tablet Take 10 mg by mouth 3 (three) times daily. 12/18/21  Yes [provider]  pantoprazole (PROTONIX) 40 MG tablet Take 1 tablet (40 mg total) by mouth 2 (two) times daily. Take 30 minutes before breakfast 10/22/21 10/22/22 Yes Carver, Charles K, DO  prednisoLONE acetate (PRED FORTE) 1 % ophthalmic suspension Place 1 drop into both eyes 4 (four) times daily. 12/09/21  Yes [provider]  QUEtiapine (SEROQUEL) 25 MG  tablet Take 25 mg by mouth at bedtime. 01/02/22  Yes [provider]    Current Facility-Administered Medications  Medication Dose Route Frequency Provider Last Rate Last Admin   heparin injection 5,000 Units  5,000 Units Subcutaneous Q8H Adefeso, Ol[place], DO   5,000 Units at 03/02/22 0606   HYDROmorphone (DILAUDID) injection 0.5 mg  0.5  mg Intravenous Q3H PRN Adefeso, Oladapo, DO   0.5 mg at 03/02/22 1210   LORazepam (ATIVAN) injection 0-4 mg  0-4 mg Intravenous Q6H Adefeso, Oladapo, DO   2 mg at 03/02/22 1209   Or   LORazepam (ATIVAN) tablet 0-4 mg  0-4 mg Oral Q6H Adefeso, Oladapo, DO   1 mg at 03/02/22 0411   [START ON 03/04/2022] LORazepam (ATIVAN) injection 0-4 mg  0-4 mg Intravenous Q12H Adefeso, Oladapo, DO       Or   [START ON 03/04/2022] LORazepam (ATIVAN) tablet 0-4 mg  0-4 mg Oral Q12H Adefeso, Oladapo, DO       ondansetron (ZOFRAN) tablet 4 mg  4 mg Oral Q6H PRN Adefeso, Oladapo, DO       Or   ondansetron (ZOFRAN) injection 4 mg  4 mg Intravenous Q6H PRN Adefeso, Oladapo, DO       thiamine (VITAMIN B1) tablet 100 mg  100 mg Oral Daily Adefeso, Oladapo, DO   100 mg at 03/02/22 1210   Or   thiamine (VITAMIN B1) injection 100 mg  100 mg Intravenous Daily Adefeso, Oladapo, DO        Allergies as of 03/01/2022 - Review Complete 03/01/2022  Allergen Reaction Noted   Atropine Swelling 03/04/2020   Other Swelling 07/02/2020    Family History  Problem Relation Age of Onset   Other Mother        "Twisted Colon"   Mental illness Mother    Diabetes Father    Hypertension Father    Pneumonia Father    Liver disease Father        unsure what   Ulcerative colitis Paternal Grandmother    Colon polyps Paternal Grandmother    Congestive Heart Failure Paternal Grandfather    Hypertension Paternal Grandfather    Diabetes Paternal Grandfather    Multiple sclerosis Paternal Grandfather    Colon cancer Neg Hx     Social History   Socioeconomic History   Marital status: Significant Other    Spouse name: Caryl Pina   Number of children: 1   Years of education: GED   Highest education level: Not on file  Occupational History   Occupation: [employer]  Tobacco Use   Smoking status: Every Day    Packs/day: 0.50    Types: Cigarettes    Passive exposure: Current   Smokeless tobacco: Former  Brewing technologist Use: Never used  Substance and Sexual Activity   Alcohol use: Not Currently    Alcohol/week: 56.0 - 70.0 standard drinks of alcohol    Types: 56 - 70 Cans of beer per week    Comment: 12-15 beers since Thansgiving   Drug use: Not Currently    Comment: no drugs since 2018   Sexual activity: Not on file  Other Topics Concern   Not on file  Social History Narrative   Lives with Lillia Corporal Caryl Pina) and their kids   Caffeine use: none   Right handed    Social Determinants of Health   Financial Resource Strain: Not on file  Food Insecurity: No Food Insecurity (03/02/2022)   Hunger Vital Sign  Worried About Charity fundraiser in the Last Year: Never true    [place] in the Last Year: Never true  Transportation Needs: No Transportation Needs (03/02/2022)   PRAPARE - Hydrologist (Medical): No    Lack of Transportation (Non-Medical): No  Physical Activity: Not on file  Stress: Not on file  Social Connections: Not on file  Intimate Partner Violence: Not At Risk (03/02/2022)   Humiliation, Afraid, Rape, and Kick questionnaire    Fear of Current or Ex-Partner: No    Emotionally Abused: No    Physically Abused: No    Sexually Abused: No     Review of Systems   Gen: Denies any fever, chills, loss of appetite, change in weight or weight loss CV: Denies chest pain, heart palpitations, syncope, edema  Resp: Denies shortness of breath with rest, cough, wheezing, coughing up blood, and pleurisy. GI: Denies vomiting blood, jaundice, and fecal incontinence.   Denies dysphagia or odynophagia. GU : Denies urinary burning, blood in urine, urinary frequency, and urinary incontinence. MS: Denies joint pain, limitation of movement, swelling, cramps, and atrophy.  Derm: Denies rash, itching, dry skin, hives. Psych: Denies depression, anxiety, memory loss, hallucinations, and confusion. Heme: Denies bruising or bleeding Neuro:  Denies any headaches,  dizziness, paresthesias, shaking  Physical Exam   Vital Signs in last 24 hours: Temp:  [98.1 F (36.7 C)-100.1 F (37.8 C)] 98.3 F (36.8 C) (12/18 1007) Pulse Rate:  [73-96] 96 (12/18 1007) Resp:  [10-20] 18 (12/18 1007) BP: (119-140)/(75-93) 119/75 (12/18 1007) SpO2:  [94 %-99 %] 94 % (12/18 1007) Weight:  [86.2 kg-87.3 kg] 87.3 kg (12/18 0540) Last BM Date : 03/01/22  General:   Alert,  jaundice, chronically ill-appearing.  Head:  Normocephalic and atraumatic. Eyes:  +scleral icterus  Ears:  Normal auditory acuity. Lungs:  Clear throughout to auscultation.   Marland Kitchen Heart:  S1 S2 present without murmurs Abdomen:  Soft, TTP LUQ and RUQ and nondistended.Normal bowel sounds, without guarding, and without rebound.   Rectal: deferred    Msk:  Symmetrical without gross deformities. Normal posture. Extremities:  Without edema. Neurologic:  Alert and  oriented x4. Negative asterixis  Psych:  Alert and cooperative. Flat affect   Intake/Output from previous day: 12/17 0701 - 12/18 0700 In: 1000 [IV Piggyback:1000] Out: -  Intake/Output this shift: No intake/output data recorded.   Labs/Studies   Recent Labs Recent Labs    03/01/22 1935 03/02/22 0801  WBC 4.2 4.5  HGB 12.3* 11.2*  HCT 36.6* 32.8*  PLT 108* 98*   BMET Recent Labs    03/01/22 1935 03/02/22 0801  NA 137 137  K 4.3 3.9  CL 102 101  CO2 28 28  GLUCOSE 98 87  BUN <5* <5*  CREATININE 0.36* 0.39*  CALCIUM 8.1* 8.0*   LFT Recent Labs    03/01/22 1935 03/02/22 0801  PROT 6.9 6.1*  ALBUMIN 2.4* 2.1*  AST 254* 225*  ALT 76* 69*  ALKPHOS 173* 151*  BILITOT 15.3* 15.7*  BILIDIR  --  8.8*   PT/INR Recent Labs    03/01/22 2023  LABPROT 16.3*  INR 1.3*    Radiology/Studies US Abdomen Limited  Result Date: 03/02/2022 CLINICAL DATA:  Mid abdominal pain. EXAM: ULTRASOUND ABDOMEN LIMITED RIGHT UPPER QUADRANT COMPARISON:  CT abdomen and pelvis 03/02/2022 FINDINGS: Gallbladder: No gallstones.  Gallbladder wall thickness of 7 mm. No sonographic Murphy sign noted by sonographer. Common bile duct: Diameter:  5 mm Liver: Heterogeneously increased parenchymal echogenicity diffusely with nodular liver contour. No focal liver lesion identified. Portal vein is patent on color Doppler imaging with normal direction of blood flow towards the liver. Other: None. IMPRESSION: 1. Cirrhosis. 2. Gallbladder wall thickening, likely secondary to chronic liver disease. No gallstones or other findings strongly suggestive of acute cholecystitis. Electronically Signed   By: Logan Bores M.D.   On: 03/02/2022 08:16   CT ABDOMEN PELVIS W CONTRAST  Result Date: 03/02/2022 CLINICAL DATA:  Abdominal pain, jaundice EXAM: CT ABDOMEN AND PELVIS WITH CONTRAST TECHNIQUE: Multidetector CT imaging of the abdomen and pelvis was performed using the standard protocol following bolus administration of intravenous contrast. RADIATION DOSE REDUCTION: This exam was performed according to the departmental dose-optimization program which includes automated exposure control, adjustment of the mA and/or kV according to patient size and/or use of iterative reconstruction technique. CONTRAST:  172m OMNIPAQUE IOHEXOL 300 MG/ML  SOLN COMPARISON:  10/17/2021 FINDINGS: Lower chest: Lung bases are clear. Hepatobiliary: Mildly nodular hepatic contour, suggesting early cirrhosis. Hepatic steatosis with hepatomegaly. Gallbladder is unremarkable. No intrahepatic or extrahepatic duct dilatation. Pancreas: Mild parenchymal atrophy. Spleen: Enlarged, measuring 15.4 cm in maximal craniocaudal dimension. Adrenals/Urinary Tract: Adrenal glands are within normal limits. Kidneys are within normal limits.  No hydronephrosis. Bladder is within normal limits. Stomach/Bowel: Stomach is within normal limits. No evidence of bowel obstruction. Normal appendix (series 2/image 63). Wall thickening involving the right colon, likely related to venous congestion given the  additional findings. Infectious/inflammatory colitis is considered unlikely. Vascular/Lymphatic: No evidence of abdominal aortic aneurysm. Portal vein is patent. Mildly prominent upper abdominal lymph nodes, including an 11 mm short axis gastrohepatic node (series 2/image 28), likely reactive. Reproductive: Prostate is unremarkable. Other: Small volume pelvic ascites. No free air. Musculoskeletal: Visualized osseous structures are within normal limits. IMPRESSION: Early cirrhosis with hepatic steatosis and hepatosplenomegaly. Small volume pelvic ascites. Portal vein is patent. Wall thickening involving the right colon, likely related to venous congestion given the additional findings. Infectious/inflammatory colitis is considered unlikely. Additional ancillary findings as above. Electronically Signed   By: SJulian HyM.D.   On: 03/02/2022 00:24     Assessment   NSUMEDH SHINSATOis a 31y.o. year old male with history of alcoholic hepatitis, chronic alcohol abuse, cirrhosis with full prior serological evaluation on file including negative Hep C and B, last inpatient in Aug [id number] alcoholic hepatitis, presenting now again with abdominal pain and jaundice in setting of recurrent ETOH intake starting around Thanksgiving. Admitted with acute alcoholic hepatitis with GI consult requested.  Acute alcoholic hepatitis with cirrhosis: resumed 12-15 beers per day around Thanksgiving. INR still pending today to calculate DF. Prior negative Hep B and C in past. No concerns for underlying infection. Although he reports abdominal pain, I suspect this is related to acute alc hep. He does not have significant ascites on [place] Will need to start prednisolone if DF greater than 32. MELD 3.0 was 23 yesterday. Pending INR today.   Cirrhosis: EGD earlier this year without varices. May need to consider early interval EGD as outpatient in light of decompensation. Needs absolute ETOH cessation. Guarded prognosis if  continues to drink alcohol. Needs Hep A and B vaccination as outpatient if not already completed.    Plan / Recommendations    Check INR to calculate MELD and DF Will need prednisolone if DF > 32 PPI BID Supportive measures Absolute ETOH cessation going forward  Outpatient cirrhosis care  03/02/2022, 1:09 PM  Annitta Needs, PhD, ANP-BC [facility]

## 2022-03-02 NOTE — Progress Notes (Signed)
DF is 41 with PT control of 12. MELD 3.0 is 25 today. Prednisolone started.

## 2022-03-02 NOTE — ED Notes (Signed)
Pt alert, oriented, and ambulatory with steady gait. Tremors present. Ativan given per CIWA. Dilaudid given for R sided abdominal pain. Wardell Heath Gerrianne Scale

## 2022-03-03 DIAGNOSIS — K7030 Alcoholic cirrhosis of liver without ascites: Secondary | ICD-10-CM | POA: Diagnosis not present

## 2022-03-03 DIAGNOSIS — F419 Anxiety disorder, unspecified: Secondary | ICD-10-CM | POA: Insufficient documentation

## 2022-03-03 DIAGNOSIS — D649 Anemia, unspecified: Secondary | ICD-10-CM

## 2022-03-03 DIAGNOSIS — R109 Unspecified abdominal pain: Secondary | ICD-10-CM | POA: Diagnosis not present

## 2022-03-03 DIAGNOSIS — D696 Thrombocytopenia, unspecified: Secondary | ICD-10-CM | POA: Diagnosis not present

## 2022-03-03 LAB — COMPREHENSIVE METABOLIC PANEL
ALT: 72 U/L — ABNORMAL HIGH (ref 0–44)
AST: 219 U/L — ABNORMAL HIGH (ref 15–41)
Albumin: 2.1 g/dL — ABNORMAL LOW (ref 3.5–5.0)
Alkaline Phosphatase: 155 U/L — ABNORMAL HIGH (ref 38–126)
Anion gap: 10 (ref 5–15)
BUN: <5 mg/dL — ABNORMAL LOW (ref 6–20)
CO2: 25 mmol/L (ref 22–32)
Calcium: 8.2 mg/dL — ABNORMAL LOW (ref 8.9–10.3)
Chloride: 104 mmol/L (ref 98–111)
Creatinine, Ser: 0.37 mg/dL — ABNORMAL LOW (ref 0.61–1.24)
GFR, Estimated: >60 mL/min (ref >60)
Glucose, Bld: 142 mg/dL — ABNORMAL HIGH (ref 70–99)
Potassium: 4.1 mmol/L (ref 3.5–5.1)
Sodium: 139 mmol/L (ref 135–145)
Total Bilirubin: 17.1 mg/dL — ABNORMAL HIGH (ref 0.3–1.2)
Total Protein: 6.6 g/dL (ref 6.5–8.1)

## 2022-03-03 LAB — PROTIME-INR
INR: 1.5 — ABNORMAL HIGH (ref 0.8–1.2)
Prothrombin Time: 18 seconds — ABNORMAL HIGH (ref 11.4–15.2)

## 2022-03-03 MED ORDER — LORAZEPAM 2 MG/ML IJ SOLN
2.0000 mg | INTRAMUSCULAR | Status: AC
  Administered 2022-03-03 (×2): 2 mg via INTRAVENOUS
  Filled 2022-03-03 (×2): qty 1

## 2022-03-03 MED ORDER — CHLORDIAZEPOXIDE HCL 25 MG PO CAPS
25.0000 mg | ORAL_CAPSULE | Freq: Four times a day (QID) | ORAL | Status: AC
  Administered 2022-03-03 – 2022-03-04 (×3): 25 mg via ORAL
  Filled 2022-03-03 (×4): qty 1

## 2022-03-03 MED ORDER — HALOPERIDOL LACTATE 5 MG/ML IJ SOLN: 5.0000 mg | Freq: Four times a day (QID) | INTRAMUSCULAR | Status: DC | PRN

## 2022-03-03 MED ORDER — CHLORDIAZEPOXIDE HCL 25 MG PO CAPS
25.0000 mg | ORAL_CAPSULE | Freq: Three times a day (TID) | ORAL | Status: AC
  Administered 2022-03-04 (×3): 25 mg via ORAL
  Filled 2022-03-03 (×3): qty 1

## 2022-03-03 MED ORDER — ADULT MULTIVITAMIN W/MINERALS CH
1.0000 | ORAL_TABLET | Freq: Every day | ORAL | Status: DC
  Administered 2022-03-03 – 2022-03-05 (×3): 1 via ORAL
  Filled 2022-03-03 (×3): qty 1

## 2022-03-03 MED ORDER — CHLORDIAZEPOXIDE HCL 25 MG PO CAPS: 25.0000 mg | ORAL_CAPSULE | Freq: Every day | ORAL | Status: DC

## 2022-03-03 MED ORDER — HYDROXYZINE HCL 25 MG PO TABS: 25.0000 mg | ORAL_TABLET | Freq: Four times a day (QID) | ORAL | Status: DC | PRN

## 2022-03-03 MED ORDER — LORAZEPAM 2 MG/ML IJ SOLN
4.0000 mg | Freq: Once | INTRAMUSCULAR | Status: AC
  Administered 2022-03-03: 4 mg via INTRAVENOUS
  Filled 2022-03-03: qty 2

## 2022-03-03 MED ORDER — CHLORDIAZEPOXIDE HCL 25 MG PO CAPS
50.0000 mg | ORAL_CAPSULE | Freq: Once | ORAL | Status: AC
  Administered 2022-03-03: 50 mg via ORAL
  Filled 2022-03-03: qty 2

## 2022-03-03 MED ORDER — LORAZEPAM 2 MG/ML IJ SOLN
1.0000 mg | INTRAMUSCULAR | Status: DC | PRN
  Administered 2022-03-03 – 2022-03-04 (×2): 1 mg via INTRAVENOUS
  Filled 2022-03-03 (×4): qty 1

## 2022-03-03 MED ORDER — LOPERAMIDE HCL 2 MG PO CAPS: 2.0000 mg | ORAL_CAPSULE | ORAL | Status: DC | PRN

## 2022-03-03 MED ORDER — ONDANSETRON 4 MG PO TBDP: 4.0000 mg | ORAL_TABLET | Freq: Four times a day (QID) | ORAL | Status: DC | PRN

## 2022-03-03 MED ORDER — DEXTROSE-NACL 5-0.45 % IV SOLN: INTRAVENOUS | Status: DC

## 2022-03-03 MED ORDER — LORAZEPAM 2 MG/ML IJ SOLN: 4.0000 mg | INTRAMUSCULAR | Status: DC | PRN

## 2022-03-03 MED ORDER — CHLORDIAZEPOXIDE HCL 25 MG PO CAPS
25.0000 mg | ORAL_CAPSULE | ORAL | Status: DC
  Filled 2022-03-03: qty 1

## 2022-03-03 MED ORDER — THIAMINE HCL 100 MG/ML IJ SOLN: 100.0000 mg | Freq: Once | INTRAMUSCULAR | Status: AC

## 2022-03-03 MED ORDER — CHLORDIAZEPOXIDE HCL 25 MG PO CAPS
25.0000 mg | ORAL_CAPSULE | Freq: Four times a day (QID) | ORAL | Status: DC | PRN
  Filled 2022-03-03: qty 1

## 2022-03-03 NOTE — Progress Notes (Signed)
Safety sitter at bedside 

## 2022-03-03 NOTE — Plan of Care (Signed)
  Problem: Health Behavior/Discharge Planning: Goal: Ability to manage health-related needs will improve Outcome: Progressing   

## 2022-03-03 NOTE — Progress Notes (Addendum)
[facility] PROGRESS NOTE    Progress Note  Jake King  UKG:[id number] DOB: June 14, 1990 DOA: 03/01/2022 PCP: Anabel Halon, MD     Brief Narrative:   Jake King is an 31 y.o. male past medical history significant for alcohol abuse essential hypertension cirrhosis alcoholic/hepatitis with ongoing tobacco and alcohol abuse comes in for 2 weeks of abdominal pain and jaundice CT abdomen and pelvis shows cirrhosis hepatic steatosis and hepatomegaly small volume ascites portal vein wall thickening of the right colon likely related to venous congestion.  GI was consulted and recommended conservative management with IV Zofran, fentanyl and started on the CIWA protocol.   Assessment/Plan:   Acute decompensated alcoholic hepatitis in the setting of alcoholic cirrhosis/alcohol intoxication: Meld 3.0 score 25. GI was consulted and he was started on steroids. LFTs are slightly improved compared to on admission.,  Except for bilirubin which is worsening. As he feels foggy anorexic and nauseated.  Acute alcoholic intoxication/alcohol withdrawal: On admission of 30, was started on gabapentin p.o. 3 times daily. Thiamine and folate was monitored with CIWA protocol. Now with alcohol withdrawal. TOC has been consulted. He is able to take oral will start him on Librium protocol, Ativan as needed as needed.  Normocytic anemia/thrombocytopenia: Likely due to to alcohol abuse.  Anxiety/depression: Continue BuSpar p.o. 3 times daily.  GERD: Continue Protonix twice a day.   DVT prophylaxis: lovenox Family Communication:none Status is: Inpatient Remains inpatient appropriate because: Acute alcoholic hepatitis    Code Status:     Code Status Orders  (From admission, onward)           Start     Ordered   03/02/22 0236  Full code  Continuous       Question:  By:  Answer:  Consent: discussion documented in EHR   03/02/22 0239           Code Status  History     Date Active Date Inactive Code Status Order ID Comments User Context   10/17/2021 2032 10/18/2021 2225 Full Code [id number]  Lilyan Gilford, DO Inpatient   07/10/2021 1901 07/13/2021 1652 Full Code [id number]  Vassie Loll, MD Inpatient         IV Access:   Peripheral IV   Procedures and diagnostic studies:   US Abdomen Limited  Result Date: 03/02/2022 CLINICAL DATA:  Mid abdominal pain. EXAM: ULTRASOUND ABDOMEN LIMITED RIGHT UPPER QUADRANT COMPARISON:  CT abdomen and pelvis 03/02/2022 FINDINGS: Gallbladder: No gallstones. Gallbladder wall thickness of 7 mm. No sonographic Murphy sign noted by sonographer. Common bile duct: Diameter: 5 mm Liver: Heterogeneously increased parenchymal echogenicity diffusely with nodular liver contour. No focal liver lesion identified. Portal vein is patent on color Doppler imaging with normal direction of blood flow towards the liver. Other: None. IMPRESSION: 1. Cirrhosis. 2. Gallbladder wall thickening, likely secondary to chronic liver disease. No gallstones or other findings strongly suggestive of acute cholecystitis. Electronically Signed   By: Sebastian Ache M.D.   On: 03/02/2022 08:16   CT ABDOMEN PELVIS W CONTRAST  Result Date: 03/02/2022 CLINICAL DATA:  Abdominal pain, jaundice EXAM: CT ABDOMEN AND PELVIS WITH CONTRAST TECHNIQUE: Multidetector CT imaging of the abdomen and pelvis was performed using the standard protocol following bolus administration of intravenous contrast. RADIATION DOSE REDUCTION: This exam was performed according to the departmental dose-optimization program which includes automated exposure control, adjustment of the mA and/or kV according to patient size and/or use of iterative reconstruction technique. CONTRAST:  OMNIPAQUE IOHEXOL  300 MG/ML  SOLN COMPARISON:  10/17/2021 FINDINGS: Lower chest: Lung bases are clear. Hepatobiliary: Mildly nodular hepatic contour, suggesting early cirrhosis. Hepatic steatosis with  hepatomegaly. Gallbladder is unremarkable. No intrahepatic or extrahepatic duct dilatation. Pancreas: Mild parenchymal atrophy. Spleen: Enlarged, measuring 15.4 cm in maximal craniocaudal dimension. Adrenals/Urinary Tract: Adrenal glands are within normal limits. Kidneys are within normal limits.  No hydronephrosis. Bladder is within normal limits. Stomach/Bowel: Stomach is within normal limits. No evidence of bowel obstruction. Normal appendix (series 2/image 63). Wall thickening involving the right colon, likely related to venous congestion given the additional findings. Infectious/inflammatory colitis is considered unlikely. Vascular/Lymphatic: No evidence of abdominal aortic aneurysm. Portal vein is patent. Mildly prominent upper abdominal lymph nodes, including an 11 mm short axis gastrohepatic node (series 2/image 28), likely reactive. Reproductive: Prostate is unremarkable. Other: Small volume pelvic ascites. No free air. Musculoskeletal: Visualized osseous structures are within normal limits. IMPRESSION: Early cirrhosis with hepatic steatosis and hepatosplenomegaly. Small volume pelvic ascites. Portal vein is patent. Wall thickening involving the right colon, likely related to venous congestion given the additional findings. Infectious/inflammatory colitis is considered unlikely. Additional ancillary findings as above. Electronically Signed   By: Charline Bills M.D.   On: 03/02/2022 00:24     Medical Consultants:   None.   Subjective:    Jake King patient relates he is nauseated and anorexic  Objective:    Vitals:   03/02/22 1814 03/02/22 2003 03/02/22 2343 03/03/22 0346  BP: 115/74 119/81 118/72 121/88  Pulse: 97 76 (!) 103 86  Resp: 20 16 20 20   Temp: 98.3 F (36.8 C) 98.6 F (37 C) 98.1 F (36.7 C) 98.4 F (36.9 C)  TempSrc: Oral Oral Oral   SpO2: 95% 95% (!) 88% 93%  Weight:      Height:       SpO2: 93 %   Intake/Output Summary (Last 24 hours) at  03/03/2022 0711 Last data filed at 03/02/2022 1700 Gross per 24 hour  Intake 240 ml  Output --  Net 240 ml   Filed Weights   03/01/22 1829 03/02/22 0540  Weight: 86.2 kg 87.3 kg    Exam: General exam: In no acute distress. Respiratory system: Good air movement and clear to auscultation. Cardiovascular system: S1 & S2 heard, RRR. No JVD. Gastrointestinal system: Abdomen is nondistended, soft and nontender.  Extremities: No pedal edema. Skin: No rashes, lesions or ulcers Psychiatry: Judgement and insight appear normal. Mood & affect appropriate.    Data Reviewed:    Labs: Basic Metabolic Panel: Recent Labs  Lab 03/01/22 1935 03/02/22 0801 03/03/22 0510  NA 137 137 139  K 4.3 3.9 4.1  CL 102 101 104  CO2 28 28 25   GLUCOSE 98 87 142*  BUN <5* <5* <5*  CREATININE 0.36* 0.39* 0.37*  CALCIUM 8.1* 8.0* 8.2*  MG  --  1.3*  --   PHOS  --  3.4  --    GFR Estimated Creatinine Clearance: 143 mL/min (A) (by C-G formula based on SCr of 0.37 mg/dL (L)). Liver Function Tests: Recent Labs  Lab 03/01/22 1935 03/02/22 0801 03/03/22 0510  AST 254* 225* 219*  ALT 76* 69* 72*  ALKPHOS 173* 151* 155*  BILITOT 15.3* 15.7* 17.1*  PROT 6.9 6.1* 6.6  ALBUMIN 2.4* 2.1* 2.1*   Recent Labs  Lab 03/01/22 1935  LIPASE 28   Recent Labs  Lab 03/01/22 1932  AMMONIA 30   Coagulation profile Recent Labs  Lab 03/01/22 2023 03/02/22 1231  03/03/22 0510  INR 1.3* 1.5* 1.5*   COVID-19 Labs  No results for input(s): "DDIMER", "FERRITIN", "LDH", "CRP" in the last 72 hours.  Lab Results  Component Value Date   SARSCOV2NAA Not Detected 01/12/2020   SARSCOV2NAA Not Detected 11/10/2018    CBC: Recent Labs  Lab 03/01/22 1935 03/02/22 0801  WBC 4.2 4.5  HGB 12.3* 11.2*  HCT 36.6* 32.8*  MCV 90.4 90.6  PLT 108* 98*   Cardiac Enzymes: No results for input(s): "CKTOTAL", "CKMB", "CKMBINDEX", "TROPONINI" in the last 168 hours. BNP (last 3 results) No results for input(s):  "PROBNP" in the last 8760 hours. CBG: No results for input(s): "GLUCAP" in the last 168 hours. D-Dimer: No results for input(s): "DDIMER" in the last 72 hours. Hgb A1c: No results for input(s): "HGBA1C" in the last 72 hours. Lipid Profile: No results for input(s): "CHOL", "HDL", "LDLCALC", "TRIG", "CHOLHDL", "LDLDIRECT" in the last 72 hours. Thyroid function studies: No results for input(s): "TSH", "T4TOTAL", "T3FREE", "THYROIDAB" in the last 72 hours.  Invalid input(s): "FREET3" Anemia work up: No results for input(s): "VITAMINB12", "FOLATE", "FERRITIN", "TIBC", "IRON", "RETICCTPCT" in the last 72 hours. Sepsis Labs: Recent Labs  Lab 03/01/22 1935 03/02/22 0801  WBC 4.2 4.5   Microbiology No results found for this or any previous visit (from the past 240 hour(s)).   Medications:    busPIRone  10 mg Oral TID   gabapentin  300 mg Oral TID   heparin injection (subcutaneous)  5,000 Units Subcutaneous Q8H   LORazepam  0-4 mg Intravenous Q6H   Or   LORazepam  0-4 mg Oral Q6H   [START ON 03/04/2022] LORazepam  0-4 mg Intravenous Q12H   Or   [START ON 03/04/2022] LORazepam  0-4 mg Oral Q12H   pantoprazole  40 mg Oral BID AC   prednisoLONE  40 mg Oral Daily   QUEtiapine  25 mg Oral QHS   thiamine  100 mg Oral Daily   Or   thiamine  100 mg Intravenous Daily   Continuous Infusions:    LOS: 1 day   Marinda Elk  [facility]  03/03/2022, 7:11 AM

## 2022-03-03 NOTE — Progress Notes (Addendum)
Gastroenterology Progress Note   Referring Provider: No ref. provider found Primary Care Physician:  Anabel Halon, MD Primary Gastroenterologist: Hennie Duos. Marletta Lor, DO Patient ID: [id number] Jake King; [id number]; Jan 07, 1991   Subjective:    Patient has a Comptroller. He is eating clear liquids. May be interested in full liquids but does not want solid food. +flatus. Abdominal pain noted in the upper abdomen.   Objective:   Vital signs in last 24 hours: Temp:  [98.1 F (36.7 C)-98.6 F (37 C)] 98.3 F (36.8 C) (12/19 0946) Pulse Rate:  [76-107] 107 (12/19 0946) Resp:  [16-20] 19 (12/19 0946) BP: (115-127)/(72-89) 127/89 (12/19 0946) SpO2:  [88 %-98 %] 98 % (12/19 0946) Last BM Date : 03/01/22 General:   Alert,  pleasant and cooperative. Alert and oriented to person, place, time.  Head:  Normocephalic and atraumatic. Eyes:  Sclera clear, + icterus.   Abdomen:  Soft,  nondistended. Mild diffuse upper abdominal tenderness. Normal bowel sounds, without guarding, and without rebound.   Extremities:  Without clubbing, deformity or edema. Neurologic:  Alert and  oriented x4;  grossly normal neurologically. Skin:  Intact without significant lesions or rashes.Jaundiced Psych:  Alert and cooperative. Normal mood and affect.  Intake/Output from previous day: 12/18 0701 - 12/19 0700 In: 240 [P.O.:240] Out: -  Intake/Output this shift: Total I/O In: 240 [P.O.:240] Out: -   Lab Results: CBC Recent Labs    03/01/22 1935 03/02/22 0801  WBC 4.2 4.5  HGB 12.3* 11.2*  HCT 36.6* 32.8*  MCV 90.4 90.6  PLT 108* 98*   BMET Recent Labs    03/01/22 1935 03/02/22 0801 03/03/22 0510  NA 137 137 139  K 4.3 3.9 4.1  CL 102 101 104  CO2 28 28 25   GLUCOSE 98 87 142*  BUN <5* <5* <5*  CREATININE 0.36* 0.39* 0.37*  CALCIUM 8.1* 8.0* 8.2*   LFTs Recent Labs    03/01/22 1935 03/02/22 0801 03/03/22 0510  BILITOT 15.3* 15.7* 17.1*  BILIDIR  --  8.8*  --   ALKPHOS 173* 151*  155*  AST 254* 225* 219*  ALT 76* 69* 72*  PROT 6.9 6.1* 6.6  ALBUMIN 2.4* 2.1* 2.1*   Recent Labs    03/01/22 1935  LIPASE 28   PT/INR Recent Labs    03/01/22 2023 03/02/22 1231 03/03/22 0510  LABPROT 16.3* 17.6* 18.0*  INR 1.3* 1.5* 1.5*         Imaging Studies: 03/05/22 Abdomen Limited  Result Date: 03/02/2022 CLINICAL DATA:  Mid abdominal pain. EXAM: ULTRASOUND ABDOMEN LIMITED RIGHT UPPER QUADRANT COMPARISON:  CT abdomen and pelvis 03/02/2022 FINDINGS: Gallbladder: No gallstones. Gallbladder wall thickness of 7 mm. No sonographic Murphy sign noted by sonographer. Common bile duct: Diameter: 5 mm Liver: Heterogeneously increased parenchymal echogenicity diffusely with nodular liver contour. No focal liver lesion identified. Portal vein is patent on color Doppler imaging with normal direction of blood flow towards the liver. Other: None. IMPRESSION: 1. Cirrhosis. 2. Gallbladder wall thickening, likely secondary to chronic liver disease. No gallstones or other findings strongly suggestive of acute cholecystitis. Electronically Signed   By: 03/04/2022 M.D.   On: 03/02/2022 08:16   CT ABDOMEN PELVIS W CONTRAST  Result Date: 03/02/2022 CLINICAL DATA:  Abdominal pain, jaundice EXAM: CT ABDOMEN AND PELVIS WITH CONTRAST TECHNIQUE: Multidetector CT imaging of the abdomen and pelvis was performed using the standard protocol following bolus administration of intravenous contrast. RADIATION DOSE REDUCTION: This exam was performed according to the  departmental dose-optimization program which includes automated exposure control, adjustment of the mA and/or kV according to patient size and/or use of iterative reconstruction technique. CONTRAST:  OMNIPAQUE IOHEXOL 300 MG/ML  SOLN COMPARISON:  10/17/2021 FINDINGS: Lower chest: Lung bases are clear. Hepatobiliary: Mildly nodular hepatic contour, suggesting early cirrhosis. Hepatic steatosis with hepatomegaly. Gallbladder is unremarkable. No  intrahepatic or extrahepatic duct dilatation. Pancreas: Mild parenchymal atrophy. Spleen: Enlarged, measuring 15.4 cm in maximal craniocaudal dimension. Adrenals/Urinary Tract: Adrenal glands are within normal limits. Kidneys are within normal limits.  No hydronephrosis. Bladder is within normal limits. Stomach/Bowel: Stomach is within normal limits. No evidence of bowel obstruction. Normal appendix (series 2/image 63). Wall thickening involving the right colon, likely related to venous congestion given the additional findings. Infectious/inflammatory colitis is considered unlikely. Vascular/Lymphatic: No evidence of abdominal aortic aneurysm. Portal vein is patent. Mildly prominent upper abdominal lymph nodes, including an 11 mm short axis gastrohepatic node (series 2/image 28), likely reactive. Reproductive: Prostate is unremarkable. Other: Small volume pelvic ascites. No free air. Musculoskeletal: Visualized osseous structures are within normal limits. IMPRESSION: Early cirrhosis with hepatic steatosis and hepatosplenomegaly. Small volume pelvic ascites. Portal vein is patent. Wall thickening involving the right colon, likely related to venous congestion given the additional findings. Infectious/inflammatory colitis is considered unlikely. Additional ancillary findings as above. Electronically Signed   By: Charline Bills M.D.   On: 03/02/2022 00:24  [2 weeks]  Assessment:   31 year old male with history of alcoholic hepatitis, chronic alcohol abuse, cirrhosis with full prior serological evaluation on file including negative hepatitis C and B, last inpatient in August 2023 with alcoholic hepatitis, presenting now again with abdominal pain and jaundice in the setting of recurrent alcohol intake starting around Thanksgiving.  Admitted with acute alcoholic hepatitis with GI consult requested.  Acute alcoholic hepatitis with cirrhosis: Resumed 12-15 beers per day around Thanksgiving.  DF 41 and MELD 25  yesterday. DF 40 and MELD Na 22 today. No concerns for underlying infection.  Started on prednisolone yesterday.  No significant ascites on ultrasound.  Cirrhosis: EGD earlier this year without varices.  Needs absolute alcohol cessation.  Needs hepatitis A and B vaccination as an outpatient if not already completed.    Plan:   Daily CMET, INR.  Continue prednisolone, plans for four weeks assuming he responds. Check Lille score on day 7. PPI BID. Absolute etoh cessation advised.  Outpatient cirrhosis care.    LOS: 1 day   Leanna Battles. [facility] [phone] 12/19/202311:50 AM

## 2022-03-04 DIAGNOSIS — K7011 Alcoholic hepatitis with ascites: Secondary | ICD-10-CM | POA: Diagnosis not present

## 2022-03-04 DIAGNOSIS — F1129 Opioid dependence with unspecified opioid-induced disorder: Secondary | ICD-10-CM

## 2022-03-04 DIAGNOSIS — F1010 Alcohol abuse, uncomplicated: Secondary | ICD-10-CM | POA: Diagnosis not present

## 2022-03-04 DIAGNOSIS — F1120 Opioid dependence, uncomplicated: Secondary | ICD-10-CM

## 2022-03-04 DIAGNOSIS — K7030 Alcoholic cirrhosis of liver without ascites: Secondary | ICD-10-CM | POA: Diagnosis not present

## 2022-03-04 LAB — COMPREHENSIVE METABOLIC PANEL
ALT: 73 U/L — ABNORMAL HIGH (ref 0–44)
AST: 194 U/L — ABNORMAL HIGH (ref 15–41)
Albumin: 2.2 g/dL — ABNORMAL LOW (ref 3.5–5.0)
Alkaline Phosphatase: 142 U/L — ABNORMAL HIGH (ref 38–126)
Anion gap: 9 (ref 5–15)
BUN: 5 mg/dL — ABNORMAL LOW (ref 6–20)
CO2: 26 mmol/L (ref 22–32)
Calcium: 8.4 mg/dL — ABNORMAL LOW (ref 8.9–10.3)
Chloride: 106 mmol/L (ref 98–111)
Creatinine, Ser: 0.49 mg/dL — ABNORMAL LOW (ref 0.61–1.24)
GFR, Estimated: >60 mL/min (ref >60)
Glucose, Bld: 129 mg/dL — ABNORMAL HIGH (ref 70–99)
Potassium: 4.2 mmol/L (ref 3.5–5.1)
Sodium: 141 mmol/L (ref 135–145)
Total Bilirubin: 16.4 mg/dL — ABNORMAL HIGH (ref 0.3–1.2)
Total Protein: 6.6 g/dL (ref 6.5–8.1)

## 2022-03-04 LAB — PROTIME-INR
INR: 1.7 — ABNORMAL HIGH (ref 0.8–1.2)
Prothrombin Time: 19.7 seconds — ABNORMAL HIGH (ref 11.4–15.2)

## 2022-03-04 MED ORDER — FOLIC ACID 1 MG PO TABS
1.0000 mg | ORAL_TABLET | Freq: Every day | ORAL | Status: DC
  Administered 2022-03-04 – 2022-03-05 (×2): 1 mg via ORAL
  Filled 2022-03-04 (×2): qty 1

## 2022-03-04 MED ORDER — BUPRENORPHINE HCL-NALOXONE HCL 2-0.5 MG SL SUBL
1.0000 | SUBLINGUAL_TABLET | Freq: Every day | SUBLINGUAL | Status: DC
  Administered 2022-03-04 – 2022-03-05 (×2): 1 via SUBLINGUAL
  Filled 2022-03-04 (×2): qty 1

## 2022-03-04 MED ORDER — IBUPROFEN 600 MG PO TABS
600.0000 mg | ORAL_TABLET | Freq: Once | ORAL | Status: AC
  Administered 2022-03-04: 600 mg via ORAL
  Filled 2022-03-04: qty 1

## 2022-03-04 MED ORDER — NICOTINE 21 MG/24HR TD PT24
21.0000 mg | MEDICATED_PATCH | Freq: Every day | TRANSDERMAL | Status: DC
  Administered 2022-03-04 – 2022-03-05 (×2): 21 mg via TRANSDERMAL
  Filled 2022-03-04 (×2): qty 1

## 2022-03-04 NOTE — Progress Notes (Signed)
Subjective: Patient is alert, oriented to person, and date, disoriented to place, and situation. He thinks he is in [place] for a head injury. He endorses some abdominal pain and nausea. No vomiting. Wife at bedside states mental status improved since yesterday.   Objective: Vital signs in last 24 hours: Temp:  [97.6 F (36.4 C)-98.3 F (36.8 C)] 97.6 F (36.4 C) (12/20 0747) Pulse Rate:  [70-107] 70 (12/20 0747) Resp:  [18-19] 19 (12/20 0747) BP: (112-138)/(77-95) 112/77 (12/20 0747) SpO2:  [95 %-99 %] 97 % (12/20 0747) Last BM Date : 03/01/22 General:   Alert. Pleasant. Disoriented to place, situation and time, oriented to self.  Head:  Normocephalic and atraumatic. Eyes:  sclera are icteric   Mouth:  Without lesions, mucosa pink and moist.  Heart:  S1, S2 present, no murmurs noted.  Lungs: Clear to auscultation bilaterally, without wheezing, rales, or rhonchi.  Abdomen:  Bowel sounds present, soft, non-tender, non-distended. No HSM or hernias noted. No rebound or guarding. No masses appreciated  Msk:  Symmetrical without gross deformities. Normal posture. Pulses:  Normal pulses noted. Extremities:  Without clubbing or edema. Neurologic:  Alert and  oriented x4;  grossly normal neurologically. No obvious asterixis though hands tremulous likely secondary to ETOH withdrawal.  Skin:  Warm and dry, intact without significant lesions, Jaundiced  Psych:  Alert and cooperative. Normal mood and affect.  Intake/Output from previous day: 12/19 0701 - 12/20 0700 In: 382.7 [P.O.:340; I.V.:42.7] Out: -  Intake/Output this shift: Total I/O In: 240 [P.O.:240] Out: -   Lab Results: Recent Labs    03/01/22 1935 03/02/22 0801  WBC 4.2 4.5  HGB 12.3* 11.2*  HCT 36.6* 32.8*  PLT 108* 98*   BMET Recent Labs    03/02/22 0801 03/03/22 0510 03/04/22 0450  NA 137 139 141  K 3.9 4.1 4.2  CL 101 104 106  CO2 28 25 26   GLUCOSE 87 142* 129*  BUN <5* <5* 5*  CREATININE 0.39*  0.37* 0.49*  CALCIUM 8.0* 8.2* 8.4*   LFT Recent Labs    03/02/22 0801 03/03/22 0510 03/04/22 0450  PROT 6.1* 6.6 6.6  ALBUMIN 2.1* 2.1* 2.2*  AST 225* 219* 194*  ALT 69* 72* 73*  ALKPHOS 151* 155* 142*  BILITOT 15.7* 17.1* 16.4*  BILIDIR 8.8*  --   --    PT/INR Recent Labs    03/03/22 0510 03/04/22 0450  LABPROT 18.0* 19.7*  INR 1.5* 1.7*   Assessment: Jake King is a 31 year old male with history of alcoholic hepatitis, chronic alcohol abuse, cirrhosis with full prior serological evaluation on file including negative hepatitis C and B, last inpatient in August 2023 with alcoholic hepatitis, presenting now again with abdominal pain and jaundice in the setting of recurrent alcohol intake starting around Thanksgiving.  Admitted with acute alcoholic hepatitis, GI consulted for further management.  Acute alcoholic hepatitis with cirrhosis: Resumed 12-15 beers per day around Thanksgiving.  DF 41 Monday, No concerns for underlying infection, therefore started on prednisolone at that time. DF today is 47.2 (PT control 13).  No significant ascites on ultrasound. Patient began having delusional behavior and presenting with signs of withdrawal, therefore started on Librium protocol by hospitalist. He is somewhat disoriented today though able to carry on a conversation. He is not a liver transplant candidate due to recent ETOH use. Overall prognosis is poor.   Cirrhosis: EGD earlier this year without varices. MELD Na 23. Needs absolute alcohol cessation. Needs hepatitis A and B vaccination  as an outpatient if not already completed. He is followed by our team as outpatient, needs continued close follow up for his cirrhosis management.     Plan: CMP, PT/INR daily Continue prednisolone, check lille score on day 7 of steroids PPI BID Absolute ETOH cessation ** Continued outpatient cirrhosis management   LOS: 2 days    03/04/2022, 9:16 AM   Jake Speros L. Jeanmarie Hubert, MSN, APRN,  AGNP-C Adult-Gerontology Nurse Practitioner [facility]

## 2022-03-04 NOTE — Assessment & Plan Note (Signed)
-   Nicotine patch ordered.  Smoking approximately 1 PPD

## 2022-03-04 NOTE — Assessment & Plan Note (Signed)
-   Patient on Suboxone at home.  Medication reviewed bedside with his significant other and also verified on database - Suboxone dose ordered for use during hospitalization

## 2022-03-04 NOTE — Progress Notes (Signed)
Progress Note    ZACKERIE SARA   DXI:[id number]  DOB: 1990/10/06  DOA: 03/01/2022     2 PCP: Anabel Halon, MD  Initial CC: abd pain  Hospital Course: Mr. Christoffersen is a 31 yo male with chronic alcohol dependence, alcoholic cirrhosis, HTN, tobacco abuse who presented with worsening abdominal pain and jaundice.  He then developed some confusion after admission as well.  He was admitted for alcohol withdrawal treatment and further workup.  He was found to have alcoholic hepatitis and initiated on steroids.  GI was also consulted on admission.  Interval History:  His significant other is present bedside this morning.  Update given with questions answered.  He is somewhat confused thinking he is in [place] currently.  Sitter present bedside as well due to patient becoming agitated and impulsive overnight.  Assessment and Plan: * Alcoholic hepatitis - Patient presented with abdominal pain and ongoing excessive alcohol use - Workup notable for alcoholic hepatitis.  GI also consulted, greatly appreciate assistance - Patient started on prednisolone -Trend CMP and PT/INR  Alcoholic cirrhosis of liver (HCC) - Ultrasound notable for underlying cirrhosis.  No significant ascites yet.  Patient strongly counseled on alcohol cessation to prevent further progression of cirrhosis -Unfortunately, at this time patient does not appear motivated for cessation  Alcohol abuse - Continue on CIWA protocol - Also on Librium taper -Significant other bedside states patient has had long-term alcohol dependence and difficulty quitting in the past -TOC for substance abuse counseling and assistance for outpatient  Opioid dependence (HCC) - Patient on Suboxone at home.  Medication reviewed bedside with his significant other and also verified on database - Suboxone dose ordered for use during hospitalization  Thrombocytopenia (HCC) - Presumed bone marrow suppression from chronic alcohol  abuse  Tobacco abuse - Nicotine patch ordered.  Smoking approximately 1 PPD   Old records reviewed in assessment of this patient  Antimicrobials:   DVT prophylaxis:  heparin injection 5,000 Units Start: 03/02/22 0600 SCDs Start: 03/02/22 0451   Code Status:   Code Status: Full Code  Mobility Assessment (last 72 hours)     Mobility Assessment     Row Name 03/04/22 0849 03/03/22 0946 03/02/22 1942 03/02/22 0540 03/02/22 0228   Does patient have an order for bedrest or is patient medically unstable No - Continue assessment No - Continue assessment No - Continue assessment No - Continue assessment --   What is the highest level of mobility based on the progressive mobility assessment? Level 6 (Walks independently in room and hall) - Balance while walking in room without assist - Complete Level 6 (Walks independently in room and hall) - Balance while walking in room without assist - Complete Level 6 (Walks independently in room and hall) - Balance while walking in room without assist - Complete Level 6 (Walks independently in room and hall) - Balance while walking in room without assist - Complete Level 5 (Walks with assist in room/hall) - Balance while stepping forward/back and can walk in room with assist - Complete    Row Name 03/02/22 01:07:41           Does patient have an order for bedrest or is patient medically unstable No - Continue assessment       What is the highest level of mobility based on the progressive mobility assessment? Level 6 (Walks independently in room and hall) - Balance while walking in room without assist - Complete  Barriers to discharge:  Disposition Plan: Home 2 to 3 days Status is: Inpatien  Objective: Blood pressure 117/78, pulse 71, temperature 97.8 F (36.6 C), temperature source Oral, resp. rate 18, height 5' 10.5" (1.791 m), weight 87.3 kg, SpO2 98 %.  Examination:  Physical Exam Constitutional:      General: He is not in  acute distress.    Appearance: Normal appearance. He is well-developed.  HENT:     Head: Normocephalic and atraumatic.     Mouth/Throat:     Mouth: Mucous membranes are moist.  Eyes:     General: Scleral icterus present.     Extraocular Movements: Extraocular movements intact.  Cardiovascular:     Rate and Rhythm: Normal rate and regular rhythm.     Heart sounds: Normal heart sounds.  Pulmonary:     Effort: Pulmonary effort is normal. No respiratory distress.     Breath sounds: Normal breath sounds. No wheezing.  Abdominal:     General: Bowel sounds are normal. There is no distension.     Palpations: Abdomen is soft.     Tenderness: There is abdominal tenderness.     Comments: Generalized tenderness, no rebound/guarding  Musculoskeletal:        General: No swelling. Normal range of motion.     Cervical back: Normal range of motion and neck supple.  Skin:    General: Skin is warm and dry.     Coloration: Skin is jaundiced.  Neurological:     General: No focal deficit present.     Mental Status: He is alert. He is disoriented.     Comments: Not oriented to place or president      Consultants:  GI  Procedures:    Data Reviewed: Results for orders placed or performed during the hospital encounter of 03/01/22 (from the past 24 hour(s))  Comprehensive metabolic panel     Status: Abnormal   Collection Time: 03/04/22  4:50 AM  Result Value Ref Range   Sodium 141 135 - 145 mmol/L   Potassium 4.2 3.5 - 5.1 mmol/L   Chloride 106 98 - 111 mmol/L   CO2 26 22 - 32 mmol/L   Glucose, Bld 129 (H) 70 - 99 mg/dL   BUN 5 (L) 6 - 20 mg/dL   Creatinine, Ser 7.35 (L) 0.61 - 1.24 mg/dL   Calcium 8.4 (L) 8.9 - 10.3 mg/dL   Total Protein 6.6 6.5 - 8.1 g/dL   Albumin 2.2 (L) 3.5 - 5.0 g/dL   AST 329 (H) 15 - 41 U/L   ALT 73 (H) 0 - 44 U/L   Alkaline Phosphatase 142 (H) 38 - 126 U/L   Total Bilirubin 16.4 (H) 0.3 - 1.2 mg/dL   GFR, Estimated >92 >42 mL/min   Anion gap 9 5 - 15   Protime-INR     Status: Abnormal   Collection Time: 03/04/22  4:50 AM  Result Value Ref Range   Prothrombin Time 19.7 (H) 11.4 - 15.2 seconds   INR 1.7 (H) 0.8 - 1.2    I have Reviewed nursing notes, Vitals, and Lab results since pt's last encounter. Pertinent lab results : see above I have ordered labwork to follow up on.  I have reviewed the last note from staff over past 24 hours I have discussed pt's care plan and test results with nursing staff, CM/SW, and other staff as appropriate  Time spent: Greater than 50% of the 55 minute visit was spent in counseling/coordination of care for  the patient as laid out in the A&P.   LOS: 2 days   Lewie Chamber, MD [facility] 03/04/2022, 3:59 PM

## 2022-03-04 NOTE — Assessment & Plan Note (Signed)
-   Continue on CIWA protocol - Also on Librium taper -Significant other bedside states patient has had long-term alcohol dependence and difficulty quitting in the past -TOC for substance abuse counseling and assistance for outpatient

## 2022-03-04 NOTE — Assessment & Plan Note (Signed)
-   Ultrasound notable for underlying cirrhosis.  No significant ascites yet.  Patient strongly counseled on alcohol cessation to prevent further progression of cirrhosis -Unfortunately, at this time patient does not appear motivated for cessation

## 2022-03-04 NOTE — Assessment & Plan Note (Signed)
-   Patient presented with abdominal pain and ongoing excessive alcohol use - Workup notable for alcoholic hepatitis.  GI also consulted, greatly appreciate assistance - Patient started on prednisolone -Trend CMP and PT/INR

## 2022-03-04 NOTE — Assessment & Plan Note (Signed)
-   Presumed bone marrow suppression from chronic alcohol abuse

## 2022-03-04 NOTE — Hospital Course (Signed)
Mr. Heinzman is a 31 yo male with chronic alcohol dependence, alcoholic cirrhosis, HTN, tobacco abuse who presented with worsening abdominal pain and jaundice.  He then developed some confusion after admission as well.  He was admitted for alcohol withdrawal treatment and further workup.  He was found to have alcoholic hepatitis and initiated on steroids.  GI was also consulted on admission.

## 2022-03-05 ENCOUNTER — Encounter: Payer: Self-pay | Admitting: Internal Medicine

## 2022-03-05 ENCOUNTER — Other Ambulatory Visit: Payer: Self-pay

## 2022-03-05 ENCOUNTER — Telehealth: Payer: Self-pay | Admitting: Gastroenterology

## 2022-03-05 DIAGNOSIS — K7010 Alcoholic hepatitis without ascites: Secondary | ICD-10-CM

## 2022-03-05 DIAGNOSIS — K7030 Alcoholic cirrhosis of liver without ascites: Secondary | ICD-10-CM | POA: Diagnosis not present

## 2022-03-05 DIAGNOSIS — F1129 Opioid dependence with unspecified opioid-induced disorder: Secondary | ICD-10-CM | POA: Diagnosis not present

## 2022-03-05 DIAGNOSIS — K7031 Alcoholic cirrhosis of liver with ascites: Secondary | ICD-10-CM

## 2022-03-05 LAB — COMPREHENSIVE METABOLIC PANEL
ALT: 67 U/L — ABNORMAL HIGH (ref 0–44)
AST: 154 U/L — ABNORMAL HIGH (ref 15–41)
Albumin: 2.1 g/dL — ABNORMAL LOW (ref 3.5–5.0)
Alkaline Phosphatase: 124 U/L (ref 38–126)
Anion gap: 8 (ref 5–15)
BUN: 8 mg/dL (ref 6–20)
CO2: 27 mmol/L (ref 22–32)
Calcium: 8.1 mg/dL — ABNORMAL LOW (ref 8.9–10.3)
Chloride: 106 mmol/L (ref 98–111)
Creatinine, Ser: 0.58 mg/dL — ABNORMAL LOW (ref 0.61–1.24)
GFR, Estimated: >60 mL/min (ref >60)
Glucose, Bld: 112 mg/dL — ABNORMAL HIGH (ref 70–99)
Potassium: 3.9 mmol/L (ref 3.5–5.1)
Sodium: 141 mmol/L (ref 135–145)
Total Bilirubin: 14.2 mg/dL — ABNORMAL HIGH (ref 0.3–1.2)
Total Protein: 6.1 g/dL — ABNORMAL LOW (ref 6.5–8.1)

## 2022-03-05 LAB — MAGNESIUM: Magnesium: 1.8 mg/dL (ref 1.7–2.4)

## 2022-03-05 LAB — CBC WITH DIFFERENTIAL/PLATELET
Abs Immature Granulocytes: 0.05 10*3/uL (ref 0.00–0.07)
Basophils Absolute: 0 10*3/uL (ref 0.0–0.1)
Basophils Relative: 0 %
Eosinophils Absolute: 0 10*3/uL (ref 0.0–0.5)
Eosinophils Relative: 0 %
HCT: 32.9 % — ABNORMAL LOW (ref 39.0–52.0)
Hemoglobin: 10.6 g/dL — ABNORMAL LOW (ref 13.0–17.0)
Immature Granulocytes: 1 %
Lymphocytes Relative: 20 %
Lymphs Abs: 1.5 10*3/uL (ref 0.7–4.0)
MCH: 30.5 pg (ref 26.0–34.0)
MCHC: 32.2 g/dL (ref 30.0–36.0)
MCV: 94.5 fL (ref 80.0–100.0)
Monocytes Absolute: 0.6 10*3/uL (ref 0.1–1.0)
Monocytes Relative: 9 %
Neutro Abs: 5 10*3/uL (ref 1.7–7.7)
Neutrophils Relative %: 70 %
Platelets: 116 10*3/uL — ABNORMAL LOW (ref 150–400)
RBC: 3.48 MIL/uL — ABNORMAL LOW (ref 4.22–5.81)
RDW: 20.9 % — ABNORMAL HIGH (ref 11.5–15.5)
WBC: 7.1 10*3/uL (ref 4.0–10.5)
nRBC: 0 % (ref 0.0–0.2)

## 2022-03-05 LAB — PROTIME-INR
INR: 1.6 — ABNORMAL HIGH (ref 0.8–1.2)
Prothrombin Time: 18.7 seconds — ABNORMAL HIGH (ref 11.4–15.2)

## 2022-03-05 MED ORDER — PREDNISOLONE SODIUM PHOSPHATE 10 MG PO TBDP: 40.0000 mg | ORAL_TABLET | Freq: Every day | ORAL | 0 refills | Status: AC

## 2022-03-05 NOTE — Telephone Encounter (Signed)
Please have him go on the 26th

## 2022-03-05 NOTE — Discharge Summary (Signed)
Physician Discharge Summary   Jake King IWO:[id number] DOB: [birth year] DOA: 03/01/2022  PCP: Jake Halon, MD  Admit date: 03/01/2022 Discharge date: 03/05/2022  Barriers to discharge: none  Admitted From: Home Disposition:  Home Discharging physician: Jake Chamber, MD  Recommendations for Outpatient Follow-up:  Follow up with GI; repeat labs  Home Health:  Equipment/Devices:   Discharge Condition: stable CODE STATUS: Full Diet recommendation:  Diet Orders (From admission, onward)     Start     Ordered   03/05/22 0000  Diet general        03/05/22 1103            Hospital Course: Mr. Jake King is a 31 yo male with chronic alcohol dependence, alcoholic cirrhosis, HTN, tobacco abuse who presented with worsening abdominal pain and jaundice.  He then developed some confusion after admission as well.  He was admitted for alcohol withdrawal treatment and further workup.  He was found to have alcoholic hepatitis and initiated on steroids.  GI was also consulted on admission.  Assessment and Plan: * Alcoholic hepatitis - Patient presented with abdominal pain and ongoing excessive alcohol use - Workup notable for alcoholic hepatitis.  GI also consulted, greatly appreciate assistance - Patient started on prednisolone -Clinically he has improved.  He is continued on prednisolone at discharge and will follow-up outpatient  Alcoholic cirrhosis of liver (HCC) - Ultrasound notable for underlying cirrhosis.  No significant ascites yet.  Patient strongly counseled on alcohol cessation to prevent further progression of cirrhosis -Patient does endorse the wish to be able to quit drinking.  He plans to continue counseling with his pain clinic  Alcohol abuse - Continue on CIWA protocol - Also on Librium taper -Significant other bedside states patient has had long-term alcohol dependence and difficulty quitting in the past -TOC for substance abuse counseling and  assistance for outpatient  Opioid dependence (HCC) - Patient on Suboxone at home.  Medication reviewed bedside with his significant other and also verified on database - Suboxone dose ordered for use during hospitalization  Thrombocytopenia (HCC) - Presumed bone marrow suppression from chronic alcohol abuse  Tobacco abuse - Nicotine patch ordered.  Smoking approximately 1 PPD       The patient's chronic medical conditions were treated accordingly per the patient's home medication regimen except as noted.  On day of discharge, patient was felt deemed stable for discharge. Patient/family member advised to call PCP or come back to ER if needed.   Principal Diagnosis: Alcoholic hepatitis  Discharge Diagnoses: Active Hospital Problems   Diagnosis Date Noted   Alcoholic hepatitis 07/10/2021    Priority: 1.   Alcoholic cirrhosis of liver (HCC) 03/03/2022    Priority: 2.   Alcohol abuse 10/17/2021    Priority: 3.   Opioid dependence (HCC) 03/04/2022    Priority: 4.   Anxiety 03/03/2022   Thrombocytopenia (HCC) 07/11/2021   Tobacco abuse 05/01/2021    Resolved Hospital Problems  No resolved problems to display.     Discharge Instructions     Diet general   Complete by: As directed    Increase activity slowly   Complete by: As directed       Allergies as of 03/05/2022       Reactions   Atropine Swelling   Eye Drops from when he was a baby   Other Swelling   SULFITES IN ALCOHOLS         Medication List     TAKE these medications  acamprosate 333 MG tablet Commonly known as: CAMPRAL Take 666 mg by mouth 3 (three) times daily.   albuterol 108 (90 Base) MCG/ACT inhaler Commonly known as: VENTOLIN HFA Inhale 2 puffs into the lungs every 4 (four) hours as needed for wheezing or shortness of breath.   Buprenorphine HCl-Naloxone HCl 8-2 MG Film Place 0.25 Film under the tongue daily.   busPIRone 10 MG tablet Commonly known as: BUSPAR Take 10 mg by mouth 3  (three) times daily.   pantoprazole 40 MG tablet Commonly known as: PROTONIX Take 1 tablet (40 mg total) by mouth 2 (two) times daily. Take 30 minutes before breakfast   prednisoLONE 10 MG disintegrating tablet Commonly known as: ORAPRED ODT Take 4 tablets (40 mg total) by mouth daily for 24 days. Start taking on: March 06, 2022   prednisoLONE acetate 1 % ophthalmic suspension Commonly known as: PRED FORTE Place 1 drop into both eyes 4 (four) times daily.   QUEtiapine 25 MG tablet Commonly known as: SEROQUEL Take 25 mg by mouth at bedtime.        Allergies  Allergen Reactions   Atropine Swelling    Eye Drops from when he was a baby   Other Swelling    SULFITES IN ALCOHOLS     Consultations: GI  Procedures:   Discharge Exam: BP 117/76 (BP Location: Left Arm)   Pulse 66   Temp 98.2 F (36.8 C) (Oral)   Resp 18   Ht 5' 10.5" (1.791 m)   Wt 87.3 kg   SpO2 94%   BMI 27.23 kg/m  Physical Exam Constitutional:      General: He is not in acute distress.    Appearance: Normal appearance. He is well-developed.  HENT:     Head: Normocephalic and atraumatic.     Mouth/Throat:     Mouth: Mucous membranes are moist.  Eyes:     General: Scleral icterus present.     Extraocular Movements: Extraocular movements intact.  Cardiovascular:     Rate and Rhythm: Normal rate and regular rhythm.     Heart sounds: Normal heart sounds.  Pulmonary:     Effort: Pulmonary effort is normal. No respiratory distress.     Breath sounds: Normal breath sounds. No wheezing.  Abdominal:     General: Bowel sounds are normal. There is no distension.     Palpations: Abdomen is soft.     Tenderness: There is no abdominal tenderness.  Musculoskeletal:        General: No swelling. Normal range of motion.     Cervical back: Normal range of motion and neck supple.  Skin:    General: Skin is warm and dry.     Coloration: Skin is jaundiced.  Neurological:     General: No focal deficit  present.     Mental Status: He is alert and oriented to person, place, and time.      The results of significant diagnostics from this hospitalization (including imaging, microbiology, ancillary and laboratory) are listed below for reference.   Microbiology: No results found for this or any previous visit (from the past 240 hour(s)).   Labs: BNP (last 3 results) No results for input(s): "BNP" in the last 8760 hours. Basic Metabolic Panel: Recent Labs  Lab 03/01/22 1935 03/02/22 0801 03/03/22 0510 03/04/22 0450 03/05/22 0430  NA 137 137 139 141 141  K 4.3 3.9 4.1 4.2 3.9  CL 102 101 104 106 106  CO2 28 28 25 26  27  GLUCOSE 98 87 142* 129* 112*  BUN <5* <5* <5* 5* 8  CREATININE 0.36* 0.39* 0.37* 0.49* 0.58*  CALCIUM 8.1* 8.0* 8.2* 8.4* 8.1*  MG  --  1.3*  --   --  1.8  PHOS  --  3.4  --   --   --    Liver Function Tests: Recent Labs  Lab 03/01/22 1935 03/02/22 0801 03/03/22 0510 03/04/22 0450 03/05/22 0430  AST 254* 225* 219* 194* 154*  ALT 76* 69* 72* 73* 67*  ALKPHOS 173* 151* 155* 142* 124  BILITOT 15.3* 15.7* 17.1* 16.4* 14.2*  PROT 6.9 6.1* 6.6 6.6 6.1*  ALBUMIN 2.4* 2.1* 2.1* 2.2* 2.1*   Recent Labs  Lab 03/01/22 1935  LIPASE 28   Recent Labs  Lab 03/01/22 1932  AMMONIA 30   CBC: Recent Labs  Lab 03/01/22 1935 03/02/22 0801 03/05/22 0430  WBC 4.2 4.5 7.1  NEUTROABS  --   --  5.0  HGB 12.3* 11.2* 10.6*  HCT 36.6* 32.8* 32.9*  MCV 90.4 90.6 94.5  PLT 108* 98* 116*   Cardiac Enzymes: No results for input(s): "CKTOTAL", "CKMB", "CKMBINDEX", "TROPONINI" in the last 168 hours. BNP: Invalid input(s): "POCBNP" CBG: No results for input(s): "GLUCAP" in the last 168 hours. D-Dimer No results for input(s): "DDIMER" in the last 72 hours. Hgb A1c No results for input(s): "HGBA1C" in the last 72 hours. Lipid Profile No results for input(s): "CHOL", "HDL", "LDLCALC", "TRIG", "CHOLHDL", "LDLDIRECT" in the last 72 hours. Thyroid function studies No  results for input(s): "TSH", "T4TOTAL", "T3FREE", "THYROIDAB" in the last 72 hours.  Invalid input(s): "FREET3" Anemia work up No results for input(s): "VITAMINB12", "FOLATE", "FERRITIN", "TIBC", "IRON", "RETICCTPCT" in the last 72 hours. Urinalysis    Component Value Date/Time   COLORURINE AMBER (A) 03/01/2022 1926   APPEARANCEUR CLEAR 03/01/2022 1926   LABSPEC 1.018 03/01/2022 1926   PHURINE 7.0 03/01/2022 1926   GLUCOSEU NEGATIVE 03/01/2022 1926   HGBUR NEGATIVE 03/01/2022 1926   BILIRUBINUR MODERATE (A) 03/01/2022 1926   KETONESUR NEGATIVE 03/01/2022 1926   PROTEINUR 30 (A) 03/01/2022 1926   NITRITE NEGATIVE 03/01/2022 1926   LEUKOCYTESUR NEGATIVE 03/01/2022 1926   Sepsis Labs Recent Labs  Lab 03/01/22 1935 03/02/22 0801 03/05/22 0430  WBC 4.2 4.5 7.1   Microbiology No results found for this or any previous visit (from the past 240 hour(s)).  Procedures/Studies: US Abdomen Limited  Result Date: 03/02/2022 CLINICAL DATA:  Mid abdominal pain. EXAM: ULTRASOUND ABDOMEN LIMITED RIGHT UPPER QUADRANT COMPARISON:  CT abdomen and pelvis 03/02/2022 FINDINGS: Gallbladder: No gallstones. Gallbladder wall thickness of 7 mm. No sonographic Murphy sign noted by sonographer. Common bile duct: Diameter: 5 mm Liver: Heterogeneously increased parenchymal echogenicity diffusely with nodular liver contour. No focal liver lesion identified. Portal vein is patent on color Doppler imaging with normal direction of blood flow towards the liver. Other: None. IMPRESSION: 1. Cirrhosis. 2. Gallbladder wall thickening, likely secondary to chronic liver disease. No gallstones or other findings strongly suggestive of acute cholecystitis. Electronically Signed   By: Sebastian AcheAllen  Grady M.D.   On: 03/02/2022 08:16   CT ABDOMEN PELVIS W CONTRAST  Result Date: 03/02/2022 CLINICAL DATA:  Abdominal pain, jaundice EXAM: CT ABDOMEN AND PELVIS WITH CONTRAST TECHNIQUE: Multidetector CT imaging of the abdomen and pelvis  was performed using the standard protocol following bolus administration of intravenous contrast. RADIATION DOSE REDUCTION: This exam was performed according to the departmental dose-optimization program which includes automated exposure control, adjustment of the mA and/or  kV according to patient size and/or use of iterative reconstruction technique. CONTRAST:  OMNIPAQUE IOHEXOL 300 MG/ML  SOLN COMPARISON:  10/17/2021 FINDINGS: Lower chest: Lung bases are clear. Hepatobiliary: Mildly nodular hepatic contour, suggesting early cirrhosis. Hepatic steatosis with hepatomegaly. Gallbladder is unremarkable. No intrahepatic or extrahepatic duct dilatation. Pancreas: Mild parenchymal atrophy. Spleen: Enlarged, measuring 15.4 cm in maximal craniocaudal dimension. Adrenals/Urinary Tract: Adrenal glands are within normal limits. Kidneys are within normal limits.  No hydronephrosis. Bladder is within normal limits. Stomach/Bowel: Stomach is within normal limits. No evidence of bowel obstruction. Normal appendix (series 2/image 63). Wall thickening involving the right colon, likely related to venous congestion given the additional findings. Infectious/inflammatory colitis is considered unlikely. Vascular/Lymphatic: No evidence of abdominal aortic aneurysm. Portal vein is patent. Mildly prominent upper abdominal lymph nodes, including an 11 mm short axis gastrohepatic node (series 2/image 28), likely reactive. Reproductive: Prostate is unremarkable. Other: Small volume pelvic ascites. No free air. Musculoskeletal: Visualized osseous structures are within normal limits. IMPRESSION: Early cirrhosis with hepatic steatosis and hepatosplenomegaly. Small volume pelvic ascites. Portal vein is patent. Wall thickening involving the right colon, likely related to venous congestion given the additional findings. Infectious/inflammatory colitis is considered unlikely. Additional ancillary findings as above. Electronically Signed   By:  Charline Bills M.D.   On: 03/02/2022 00:24     Time coordinating discharge: Over 30 minutes    Jake Chamber, MD  [facility] 03/05/2022, 5:01 PM

## 2022-03-05 NOTE — Progress Notes (Signed)
IV removed and discharge instructions reviewed.  Scripts sent to pharmacy. Requested work note just now and secure chatted Dr. Frederick Peers.   Patient stated that his ride is here and he will return for note after he gets his car.

## 2022-03-05 NOTE — Telephone Encounter (Signed)
Patient discharged from hospital today.   Please arrange for labs on December 24th: CMET, PT/INR, CBC Patient needs follow up with Dr. Marletta Lor or APP in 2 weeks.

## 2022-03-05 NOTE — Telephone Encounter (Signed)
Lmom for pt to return my call.  

## 2022-03-06 NOTE — Telephone Encounter (Signed)
Message was sent to pt via myChart on 03/05/2021 instructing pt to go to the lab on 03/10/2022. Pt read message this morning. Please arrange follow up with Dr. Marletta Lor or APP in 2 weeks.

## 2022-03-12 ENCOUNTER — Encounter: Payer: Self-pay | Admitting: Gastroenterology

## 2022-03-12 ENCOUNTER — Ambulatory Visit (INDEPENDENT_AMBULATORY_CARE_PROVIDER_SITE_OTHER): Payer: BC Managed Care – PPO | Admitting: Gastroenterology

## 2022-03-12 ENCOUNTER — Encounter: Payer: Self-pay | Admitting: *Deleted

## 2022-03-12 ENCOUNTER — Telehealth: Payer: Self-pay | Admitting: Gastroenterology

## 2022-03-12 VITALS — BP 116/73 | HR 74 | Temp 98.1°F | Ht 71.0 in | Wt 204.4 lb

## 2022-03-12 DIAGNOSIS — K859 Acute pancreatitis without necrosis or infection, unspecified: Secondary | ICD-10-CM | POA: Diagnosis not present

## 2022-03-12 DIAGNOSIS — K7010 Alcoholic hepatitis without ascites: Secondary | ICD-10-CM | POA: Diagnosis not present

## 2022-03-12 DIAGNOSIS — K7031 Alcoholic cirrhosis of liver with ascites: Secondary | ICD-10-CM | POA: Diagnosis not present

## 2022-03-12 DIAGNOSIS — R7989 Other specified abnormal findings of blood chemistry: Secondary | ICD-10-CM | POA: Diagnosis not present

## 2022-03-12 DIAGNOSIS — F17200 Nicotine dependence, unspecified, uncomplicated: Secondary | ICD-10-CM | POA: Diagnosis not present

## 2022-03-12 DIAGNOSIS — F1120 Opioid dependence, uncomplicated: Secondary | ICD-10-CM | POA: Diagnosis not present

## 2022-03-12 DIAGNOSIS — K7030 Alcoholic cirrhosis of liver without ascites: Secondary | ICD-10-CM | POA: Diagnosis not present

## 2022-03-12 DIAGNOSIS — K219 Gastro-esophageal reflux disease without esophagitis: Secondary | ICD-10-CM | POA: Diagnosis not present

## 2022-03-12 MED ORDER — PREDNISOLONE SODIUM PHOSPHATE 10 MG PO TBDP: ORAL_TABLET | ORAL | 0 refills | Status: AC

## 2022-03-12 MED ORDER — PREDNISOLONE 15 MG/5ML PO SOLN: ORAL | 0 refills | Status: AC

## 2022-03-12 NOTE — Patient Instructions (Signed)
I have sent in prednisolone tablets to take once you complete the 40 mg suspension bottle. Take 30 mg for one week, 20 mg for one week, 10 mg for one week, then stop  We will see you in 3 months!  Continue to completely avoid alcohol!  I enjoyed seeing you again today! As you know, I value our relationship and want to provide genuine, compassionate, and quality care. I welcome your feedback. If you receive a survey regarding your visit,  I greatly appreciate you taking time to fill this out. See you next time!  Gelene Mink, PhD, ANP-BC [facility]

## 2022-03-12 NOTE — Telephone Encounter (Signed)
Pt states that he is unsure but will ask his pcp and get them if he has not.

## 2022-03-12 NOTE — Progress Notes (Signed)
Gastroenterology Office Note     Primary Care Physician:  Anabel Halon, MD  Primary Gastroenterologist: Dr. Marletta Lor    Chief Complaint   Chief Complaint  Patient presents with   Follow-up     History of Present Illness   Jake King is a 31 y.o. male presenting today in follow-up with a history of alcoholic hepatitis, chronic alcohol abuse, cirrhosis with full prior serological evaluation on file including negative hepatitis C and B, inpatient in August 2023 with alcoholic hepatitis, recently inpatient Dec 2023 with alcoholic hepatitis.   Started on prednisolone 12/18 while inpatient. Discharged 12/21. Need taper for prednisolone.  NO ETOH since hospitalization. Tolerating diet. Minor abdominal discomfort. No confusion. No mental status changes. No overt GI bleeding. Denies abdominal distension or lower extremity edema. Pantoprazole BID for GERD.      Past Medical History:  Diagnosis Date   Brain mass    pineal cyst   Cirrhosis (HCC)    Fatty liver    Hepatitis    Hypertension    Migraine    Right knee injury    Seizures (HCC)    Vitamin D deficiency     Past Surgical History:  Procedure Laterality Date   BIOPSY  06/10/2021   Procedure: BIOPSY;  Surgeon: Lanelle Bal, DO;  [facility];  Service: Endoscopy;;   ESOPHAGOGASTRODUODENOSCOPY (EGD) WITH PROPOFOL N/A 06/10/2021   Procedure: ESOPHAGOGASTRODUODENOSCOPY (EGD) WITH PROPOFOL;  Surgeon: Lanelle Bal, DO;  [facility];  Service: Endoscopy;  Laterality: N/A;  11:15am, ASA 2   NASAL SINUS SURGERY     NASAL SINUS SURGERY  2009    Current Outpatient Medications  Medication Sig Dispense Refill   acamprosate (CAMPRAL) 333 MG tablet Take 666 mg by mouth 3 (three) times daily.     albuterol (VENTOLIN HFA) 108 (90 Base) MCG/ACT inhaler Inhale 2 puffs into the lungs every 4 (four) hours as needed for wheezing or shortness of breath. 18 g 5   b complex vitamins capsule  Take 1 capsule by mouth daily.     Buprenorphine HCl-Naloxone HCl 8-2 MG FILM Place 0.25 Film under the tongue daily.     busPIRone (BUSPAR) 10 MG tablet Take 10 mg by mouth 3 (three) times daily.     folic acid (FOLVITE) 1 MG tablet Take 1 mg by mouth daily.     Multiple Vitamin (MULTIVITAMIN) tablet Take 1 tablet by mouth daily.     pantoprazole (PROTONIX) 40 MG tablet Take 1 tablet (40 mg total) by mouth 2 (two) times daily. Take 30 minutes before breakfast 60 tablet 11   prednisoLONE (ORAPRED ODT) 10 MG disintegrating tablet Take 4 tablets (40 mg total) by mouth daily for 24 days. 96 tablet 0   prednisoLONE acetate (PRED FORTE) 1 % ophthalmic suspension Place 1 drop into both eyes 4 (four) times daily.     QUEtiapine (SEROQUEL) 25 MG tablet Take 25 mg by mouth at bedtime.     Vitamin D-Vitamin K (VITAMIN K2-VITAMIN D3 PO) Take by mouth.     No current facility-administered medications for this visit.    Allergies as of 03/12/2022 - Review Complete 03/12/2022  Allergen Reaction Noted   Atropine Swelling 03/04/2020   Other Swelling 07/02/2020    Family History  Problem Relation Age of Onset   Other Mother        "Twisted Colon"   Mental illness Mother    Diabetes Father    Hypertension Father  Pneumonia Father    Liver disease Father        unsure what   Ulcerative colitis Paternal Grandmother    Colon polyps Paternal Grandmother    Congestive Heart Failure Paternal Grandfather    Hypertension Paternal Grandfather    Diabetes Paternal Grandfather    Multiple sclerosis Paternal Grandfather    Colon cancer Neg Hx     Social History   Socioeconomic History   Marital status: Significant Other    Spouse name: [name]   Number of children: 1   Years of education: GED   Highest education level: Not on file  Occupational History   Occupation: [employer]  Tobacco Use   Smoking status: Every Day    Packs/day: 0.50    Types: Cigarettes    Passive exposure: Current    Smokeless tobacco: Former  Building services engineer Use: Never used  Substance and Sexual Activity   Alcohol use: Not Currently    Alcohol/week: 56.0 - 70.0 standard drinks of alcohol    Types: 56 - 70 Cans of beer per week    Comment: 12-15 beers since Thansgiving   Drug use: Not Currently    Comment: no drugs since 2018   Sexual activity: Yes  Other Topics Concern   Not on file  Social History Narrative   Lives with Salvadore Dom [name]) and their kids   Caffeine use: none   Right handed    Social Determinants of Health   Financial Resource Strain: Not on file  Food Insecurity: No Food Insecurity (03/02/2022)   Hunger Vital Sign    Worried About Running Out of Food in the Last Year: Never true    Ran Out of Food in the Last Year: Never true  Transportation Needs: No Transportation Needs (03/02/2022)   PRAPARE - Administrator, Civil Service (Medical): No    Lack of Transportation (Non-Medical): No  Physical Activity: Not on file  Stress: Not on file  Social Connections: Not on file  Intimate Partner Violence: Not At Risk (03/02/2022)   Humiliation, Afraid, Rape, and Kick questionnaire    Fear of Current or Ex-Partner: No    Emotionally Abused: No    Physically Abused: No    Sexually Abused: No     Review of Systems   Gen: Denies any fever, chills, fatigue, weight loss, lack of appetite.  CV: Denies chest pain, heart palpitations, peripheral edema, syncope.  Resp: Denies shortness of breath at rest or with exertion. Denies wheezing or cough.  GI: Denies dysphagia or odynophagia. Denies jaundice, hematemesis, fecal incontinence. GU : Denies urinary burning, urinary frequency, urinary hesitancy MS: Denies joint pain, muscle weakness, cramps, or limitation of movement.  Derm: Denies rash, itching, dry skin Psych: Denies depression, anxiety, memory loss, and confusion Heme: Denies bruising, bleeding, and enlarged lymph nodes.   Physical Exam   BP 116/73 (BP  Location: Right Arm, Patient Position: Sitting, Cuff Size: Large)   Pulse 74   Temp 98.1 F (36.7 C) (Oral)   Ht 5\' 11"  (1.803 m)   Wt 204 lb 6.4 oz (92.7 kg)   SpO2 95%   BMI 28.51 kg/m  General:   Alert and oriented. Pleasant and cooperative. Jaundiced.  Head:  Normocephalic and atraumatic. Eyes:  With mild scleral icterus.  Abdomen:  +BS, soft, non-tender and non-distended. Mild RUQ TTP, hepatomegaly Rectal:  Deferred  Msk:  Symmetrical without gross deformities. Normal posture. Extremities:  Without edema. Neurologic:  Alert and  oriented x4;  grossly normal neurologically. Skin:  Intact without significant lesions or rashes. Psych:  Alert and cooperative. Normal mood and affect.   Assessment   Jake King is a 31 y.o. male presenting today in follow-up with a history of alcoholic hepatitis, chronic alcohol abuse, cirrhosis with full prior serological evaluation on file including negative hepatitis C and B, inpatient in August 2023 with alcoholic hepatitis, recently inpatient Dec 2023 with alcoholic hepatitis.   Clinically improving after discharge. Needs updated labs to calculate Lille score. He has abstained from ETOH since hospitalization and is eager to avoid this going forward. He is aware of the high risk of mortality with resuming alcohol.  GERD well-controlled with pantoprazole BID.   I have given a taper for prednisolone for him to start once completing the 28 day suspension.   PLAN    Continue prednisolone, taper provided Labs today [place] in 6 months Close follow-up in 3 months Completely avoid ETOH Needs Hep A/B vaccinations if not already completed. Will have nursing staff reach out to him regarding this.    Gelene Mink, PhD, ANP-BC [facility] Gastroenter[facility]

## 2022-03-12 NOTE — Addendum Note (Signed)
Addended by: Gelene Mink on: 03/12/2022 03:39 PM   Modules accepted: Orders

## 2022-03-12 NOTE — Telephone Encounter (Signed)
Lmom for pt to return my call.  

## 2022-03-12 NOTE — Telephone Encounter (Signed)
Can we find out if patient has had Hep A/B vaccinations? Labs from last year showed he was not immune to Hep B.

## 2022-03-13 LAB — CBC WITH DIFFERENTIAL/PLATELET
Basophils Absolute: 0 10*3/uL (ref 0.0–0.2)
Basos: 0 %
EOS (ABSOLUTE): 0 10*3/uL (ref 0.0–0.4)
Eos: 0 %
Hematocrit: 37 % — ABNORMAL LOW (ref 37.5–51.0)
Hemoglobin: 12.9 g/dL — ABNORMAL LOW (ref 13.0–17.7)
Immature Grans (Abs): 0.1 10*3/uL (ref 0.0–0.1)
Immature Granulocytes: 1 %
Lymphocytes Absolute: 1.1 10*3/uL (ref 0.7–3.1)
Lymphs: 13 %
MCH: 31.5 pg (ref 26.6–33.0)
MCHC: 34.9 g/dL (ref 31.5–35.7)
MCV: 91 fL (ref 79–97)
Monocytes Absolute: 0.6 10*3/uL (ref 0.1–0.9)
Monocytes: 7 %
Neutrophils Absolute: 7.1 10*3/uL — ABNORMAL HIGH (ref 1.4–7.0)
Neutrophils: 79 %
Platelets: 80 10*3/uL — CL (ref 150–450)
RBC: 4.09 x10E6/uL — ABNORMAL LOW (ref 4.14–5.80)
RDW: 20.7 % — ABNORMAL HIGH (ref 11.6–15.4)
WBC: 9 10*3/uL (ref 3.4–10.8)

## 2022-03-13 LAB — COMPREHENSIVE METABOLIC PANEL
ALT: 102 IU/L — ABNORMAL HIGH (ref 0–44)
AST: 157 IU/L — ABNORMAL HIGH (ref 0–40)
Albumin/Globulin Ratio: 0.9 — ABNORMAL LOW (ref 1.2–2.2)
Albumin: 3.2 g/dL — ABNORMAL LOW (ref 4.3–5.2)
Alkaline Phosphatase: 174 IU/L — ABNORMAL HIGH (ref 44–121)
BUN/Creatinine Ratio: 13 (ref 9–20)
BUN: 12 mg/dL (ref 6–20)
Bilirubin Total: 10.4 mg/dL — ABNORMAL HIGH (ref 0.0–1.2)
CO2: 24 mmol/L (ref 20–29)
Calcium: 8.5 mg/dL — ABNORMAL LOW (ref 8.7–10.2)
Chloride: 100 mmol/L (ref 96–106)
Creatinine, Ser: 0.94 mg/dL (ref 0.76–1.27)
Globulin, Total: 3.4 g/dL (ref 1.5–4.5)
Glucose: 116 mg/dL — ABNORMAL HIGH (ref 70–99)
Potassium: 4.6 mmol/L (ref 3.5–5.2)
Sodium: 138 mmol/L (ref 134–144)
Total Protein: 6.6 g/dL (ref 6.0–8.5)
eGFR: 113 mL/min/{1.73_m2} (ref >59)

## 2022-03-13 LAB — PROTIME-INR
INR: 1.1 (ref 0.9–1.2)
Prothrombin Time: 11.8 s (ref 9.1–12.0)

## 2022-03-13 NOTE — Telephone Encounter (Signed)
Patient seen by Lewie Loron yesterday.

## 2022-03-18 DIAGNOSIS — K859 Acute pancreatitis without necrosis or infection, unspecified: Secondary | ICD-10-CM | POA: Diagnosis not present

## 2022-03-18 DIAGNOSIS — F17200 Nicotine dependence, unspecified, uncomplicated: Secondary | ICD-10-CM | POA: Diagnosis not present

## 2022-03-18 DIAGNOSIS — K7010 Alcoholic hepatitis without ascites: Secondary | ICD-10-CM | POA: Diagnosis not present

## 2022-03-18 DIAGNOSIS — F1120 Opioid dependence, uncomplicated: Secondary | ICD-10-CM | POA: Diagnosis not present

## 2022-03-24 ENCOUNTER — Telehealth: Payer: Self-pay

## 2022-03-24 LAB — HEMOCHROMATOSIS DNA-PCR(C282Y,H63D)

## 2022-03-24 LAB — EXTRA SPECIMEN

## 2022-03-24 NOTE — Telephone Encounter (Signed)
McCaskill I'm sorry I was working on several different PA's at once. This is a error on my part and I will contact the pt concerning the ODT

## 2022-03-24 NOTE — Telephone Encounter (Signed)
Also pt was denied X 2 for Viberzi. We could do the savings card for pt unless you want to change prescription or any ideas. Please advise

## 2022-03-24 NOTE — Telephone Encounter (Signed)
Please call patient. That was initially sent in, but I changed it to an oral solution. He should be taking the oral solution. No need for the ODT.

## 2022-03-24 NOTE — Telephone Encounter (Signed)
I just need him to taper down after he completes the solution. If he has the oral disintegrating tabs instead, he can use that to taper down over the next few weeks as outlined.

## 2022-03-24 NOTE — Telephone Encounter (Signed)
Patient has not been prescribed Viberzi. Can we double check on that?

## 2022-03-24 NOTE — Telephone Encounter (Signed)
error 

## 2022-03-24 NOTE — Telephone Encounter (Signed)
Phoned and advised the pt of the instructions to his medications. Pt expressed understanding

## 2022-03-24 NOTE — Telephone Encounter (Signed)
Phoned and spoke with the pt he is taking the solution but have the tabs for when he finishes the oral solution. He states he hasn't even opened it because he still has some ( a few days) left. Pt understands the oral solution is what you want him to be on

## 2022-03-24 NOTE — Telephone Encounter (Signed)
Hey can you tell me why this pt was prescribed Orapred ODT by [place]. A PA is needed but also another Dr has prescribed this as well. Please advise

## 2022-03-25 DIAGNOSIS — K7010 Alcoholic hepatitis without ascites: Secondary | ICD-10-CM | POA: Diagnosis not present

## 2022-03-25 DIAGNOSIS — F17200 Nicotine dependence, unspecified, uncomplicated: Secondary | ICD-10-CM | POA: Diagnosis not present

## 2022-03-25 DIAGNOSIS — F1120 Opioid dependence, uncomplicated: Secondary | ICD-10-CM | POA: Diagnosis not present

## 2022-03-25 DIAGNOSIS — K769 Liver disease, unspecified: Secondary | ICD-10-CM | POA: Diagnosis not present

## 2022-03-30 ENCOUNTER — Other Ambulatory Visit: Payer: Self-pay

## 2022-03-30 DIAGNOSIS — K219 Gastro-esophageal reflux disease without esophagitis: Secondary | ICD-10-CM

## 2022-03-30 DIAGNOSIS — K7030 Alcoholic cirrhosis of liver without ascites: Secondary | ICD-10-CM

## 2022-04-03 DIAGNOSIS — F1120 Opioid dependence, uncomplicated: Secondary | ICD-10-CM | POA: Diagnosis not present

## 2022-04-03 DIAGNOSIS — K7010 Alcoholic hepatitis without ascites: Secondary | ICD-10-CM | POA: Diagnosis not present

## 2022-04-03 DIAGNOSIS — F17200 Nicotine dependence, unspecified, uncomplicated: Secondary | ICD-10-CM | POA: Diagnosis not present

## 2022-04-08 ENCOUNTER — Other Ambulatory Visit: Payer: Self-pay

## 2022-04-08 DIAGNOSIS — K219 Gastro-esophageal reflux disease without esophagitis: Secondary | ICD-10-CM

## 2022-04-08 DIAGNOSIS — K7030 Alcoholic cirrhosis of liver without ascites: Secondary | ICD-10-CM

## 2022-04-10 DIAGNOSIS — F1120 Opioid dependence, uncomplicated: Secondary | ICD-10-CM | POA: Diagnosis not present

## 2022-04-16 ENCOUNTER — Ambulatory Visit: Payer: BC Managed Care – PPO | Admitting: Internal Medicine

## 2022-04-16 DIAGNOSIS — K769 Liver disease, unspecified: Secondary | ICD-10-CM | POA: Diagnosis not present

## 2022-04-16 DIAGNOSIS — F17200 Nicotine dependence, unspecified, uncomplicated: Secondary | ICD-10-CM | POA: Diagnosis not present

## 2022-04-16 DIAGNOSIS — K7010 Alcoholic hepatitis without ascites: Secondary | ICD-10-CM | POA: Diagnosis not present

## 2022-04-16 DIAGNOSIS — F1120 Opioid dependence, uncomplicated: Secondary | ICD-10-CM | POA: Diagnosis not present

## 2022-04-17 DIAGNOSIS — F1120 Opioid dependence, uncomplicated: Secondary | ICD-10-CM | POA: Diagnosis not present

## 2022-05-01 DIAGNOSIS — K7460 Unspecified cirrhosis of liver: Secondary | ICD-10-CM | POA: Diagnosis not present

## 2022-05-01 DIAGNOSIS — K7010 Alcoholic hepatitis without ascites: Secondary | ICD-10-CM | POA: Diagnosis not present

## 2022-05-01 DIAGNOSIS — K769 Liver disease, unspecified: Secondary | ICD-10-CM | POA: Diagnosis not present

## 2022-05-01 DIAGNOSIS — F1120 Opioid dependence, uncomplicated: Secondary | ICD-10-CM | POA: Diagnosis not present

## 2022-05-20 DIAGNOSIS — F1120 Opioid dependence, uncomplicated: Secondary | ICD-10-CM | POA: Diagnosis not present

## 2022-05-21 DIAGNOSIS — K859 Acute pancreatitis without necrosis or infection, unspecified: Secondary | ICD-10-CM | POA: Diagnosis not present

## 2022-05-21 DIAGNOSIS — F17200 Nicotine dependence, unspecified, uncomplicated: Secondary | ICD-10-CM | POA: Diagnosis not present

## 2022-05-21 DIAGNOSIS — F1120 Opioid dependence, uncomplicated: Secondary | ICD-10-CM | POA: Diagnosis not present

## 2022-05-21 DIAGNOSIS — K7010 Alcoholic hepatitis without ascites: Secondary | ICD-10-CM | POA: Diagnosis not present

## 2022-05-26 NOTE — Progress Notes (Unsigned)
Referring Provider: Lindell Spar, MD Primary Care Physician:  Lindell Spar, MD Primary GI Physician: Dr. Abbey Chatters  Chief Complaint  Patient presents with   Follow-up    Swelling in liver area and pain left side in the back.     HPI:   Jake King is a 32 y.o. male with history of chronic alcohol abuse, GERD, cirrhosis with full prior serologic evaluation on file including negative hepatitis, recurrent alcoholic hepatitis, presenting today for follow-up.  Last seen in our office 03/12/2022 for hospital follow-up with alcoholic hepatitis.  He was on prednisolone.  Denies alcohol since hospitalization.  Reported minor abdominal discomfort.  No other significant symptoms.  GERD controlled on pantoprazole twice daily.  Recommended updating labs, continue prednisone and complete taper, follow-up in 3 months.  Today: Swelling and pressure in the RUQ. Pressure. Slight pain in the LUQ, postprandially. Occasional throbbing pain in low back. Last a min or so. Some nausea and vomiting. Vomiting in the morning from mucous in his throat. Gagging on mucous. Some bloody mucous from his tonsils. This is a chronic issue for him. No reflux on PPI BID.   Can only eat small meals. Gets full quickly.  Does not have a great appetite.  Comes when his liver is "swollen".  Was feeling well when he was on prednisolone. Gained to 215 and back to 210.   All of his current symptoms start when in his liver gets inflamed.  Then symptoms resolved once his liver is better.  Has noticed some skin discoloration and slight yellowing of his eyes.   He has continued to drink 2-3 beer daily.  He is trying to decrease alcohol use.  He is on Campral and also seeing an Chartered certified accountant.    Last Labs: 03/12/2022-alk phos 174, AST 157, ALT 102, total bilirubin 10.4, creatinine 0.94, INR 1.1, hemoglobin 12.9 with normocytic indices, platelets 80. MELD:  [place]: 03/02/2022.  No focal liver lesion. Hep A/B  vaccination: Not completed.  EGD: 06/10/2021 without varices Ascites/peripheral edema: None.  Paracentesis: Never. Diuretics: None Encephalopathy: None.    Denies brbpr or melena. Bowels moving well. Asking about prior hemochromatosis testing.  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)    Fatty liver    Hepatitis    Hypertension    Migraine    Right knee injury    Seizures ([place])    Vitamin D deficiency     Past Surgical History:  Procedure Laterality Date   BIOPSY  06/10/2021   Procedure: BIOPSY;  Surgeon: Eloise Harman, DO;  [facility];  Service: Endoscopy;;   ESOPHAGOGASTRODUODENOSCOPY (EGD) WITH PROPOFOL N/A 06/10/2021   Procedure: ESOPHAGOGASTRODUODENOSCOPY (EGD) WITH PROPOFOL;  Surgeon: Eloise Harman, DO;  [facility];  Service: Endoscopy;  Laterality: N/A;  11:15am, ASA 2   NASAL SINUS SURGERY     NASAL SINUS SURGERY  2009    Current Outpatient Medications  Medication Sig Dispense Refill   acamprosate (CAMPRAL) 333 MG tablet Take 666 mg by mouth 3 (three) times daily.     albuterol (VENTOLIN HFA) 108 (90 Base) MCG/ACT inhaler Inhale 2 puffs into the lungs every 4 (four) hours as needed for wheezing or shortness of breath. 18 g 5   b complex vitamins capsule Take 1 capsule by mouth daily.  Buprenorphine HCl-Naloxone HCl 8-2 MG FILM Place 0.25 Film under the tongue daily.     busPIRone (BUSPAR) 10 MG tablet Take 10 mg by mouth 3 (three) times daily.     folic acid (FOLVITE) 1 MG tablet Take 1 mg by mouth daily.     gabapentin (NEURONTIN) 100 MG capsule Take 100 mg by mouth 3 (three) times daily.     Multiple Vitamin (MULTIVITAMIN) tablet Take 1 tablet by mouth daily.     pantoprazole (PROTONIX) 40 MG tablet Take 1 tablet (40 mg total) by mouth 2 (two) times daily. Take 30 minutes before breakfast 60 tablet 11    prednisoLONE acetate (PRED FORTE) 1 % ophthalmic suspension Place 1 drop into both eyes 4 (four) times daily.     QUEtiapine (SEROQUEL) 25 MG tablet Take 25 mg by mouth at bedtime.     Vitamin D-Vitamin K (VITAMIN K2-VITAMIN D3 PO) Take by mouth.     No current facility-administered medications for this visit.    Allergies as of 05/28/2022 - Review Complete 05/28/2022  Allergen Reaction Noted   Atropine Swelling 03/04/2020   Other Swelling 07/02/2020    Family History  Problem Relation Age of Onset   Other Mother        "Twisted Colon"   Mental illness Mother    Diabetes Father    Hypertension Father    Pneumonia Father    Liver disease Father        unsure what   Ulcerative colitis Paternal Grandmother    Colon polyps Paternal Grandmother    Congestive Heart Failure Paternal Grandfather    Hypertension Paternal Grandfather    Diabetes Paternal Grandfather    Multiple sclerosis Paternal Grandfather    Colon cancer Neg Hx     Social History   Socioeconomic History   Marital status: Significant Other    Spouse name: Caryl Pina   Number of children: 1   Years of education: GED   Highest education level: Not on file  Occupational History   Occupation: [employer]  Tobacco Use   Smoking status: Every Day    Packs/day: .5    Types: Cigarettes    Passive exposure: Current   Smokeless tobacco: Former  Scientific laboratory technician Use: Never used  Substance and Sexual Activity   Alcohol use: Not Currently    Alcohol/week: 56.0 - 70.0 standard drinks of alcohol    Types: 56 - 70 Cans of beer per week    Comment: 12-15 beers since Thansgiving   Drug use: Not Currently    Comment: no drugs since 2018   Sexual activity: Yes  Other Topics Concern   Not on file  Social History Narrative   Lives with Lillia Corporal Caryl Pina) and their kids   Caffeine use: none   Right handed    Social Determinants of Health   Financial Resource Strain: Not on file  Food Insecurity: No Food Insecurity  (03/02/2022)   Hunger Vital Sign    Worried About Running Out of Food in the Last Year: Never true    Ran Out of Food in the Last Year: Never true  Transportation Needs: No Transportation Needs (03/02/2022)   PRAPARE - Hydrologist (Medical): No    Lack of Transportation (Non-Medical): No  Physical Activity: Not on file  Stress: Not on file  Social Connections: Not on file    Review of Systems: Gen: Denies fever, chills, cold or flulike symptoms, presyncope, syncope. CV:  Denies chest pain, palpitations. Resp: Denies dyspnea, cough. GI: See HPI Heme: See HPI  Physical Exam: BP 113/78 (BP Location: Right Arm, Patient Position: Sitting, Cuff Size: Large)   Pulse 79   Temp 98.4 F (36.9 C) (Temporal)   Ht 5' 10.5" (1.791 m)   Wt 210 lb 9.6 oz (95.5 kg)   SpO2 97%   BMI 29.79 kg/m  General:   Alert and oriented. No distress noted. Pleasant and cooperative.  Head:  Normocephalic and atraumatic. Eyes:  slight  scleral icterus. Heart:  S1, S2 present without murmurs appreciated. Lungs:  Clear to auscultation bilaterally. No wheezes, rales, or rhonchi. No distress.  Abdomen:  +BS, soft, and non-distended.  Hepatomegaly appreciated without TTP in RUQ.  Very mild TTP in LUQ.  No rebound or guarding.  Msk:  Symmetrical without gross deformities. Normal posture. Extremities:  Without edema. Neurologic:  Alert and  oriented x4 Psych:  Normal mood and affect.    Assessment:  32 y.o. male with history of chronic alcohol abuse, GERD, cirrhosis with full prior serologic evaluation on file including negative hepatitis, recurrent alcoholic hepatitis, presenting today for follow-up reporting pressure in RUQ, mild LUQ pain, early satiety.   Upper abdominal pain/early satiety: She reports return of pressure in the right upper quadrant, mild intermittent postprandial LUQ abdominal pain, and early satiety/lack of appetite.  Some nausea/vomiting, but this is typically  in the morning related to mucus in his throat rather than postprandially.  Reflux well-controlled.  Unfortunately, patient has continued to drink alcohol daily.  Query whether patient is developing recurrent alcoholic hepatitis and/or pancreatitis as he reports these are the same symptoms he always has when his liver is "inflamed".  On exam, he has slight scleral icterus, very mild TTP in LUQ, and hepatomegaly without RUQ TTP.  Will update labs with further recommendations to follow.  Cirrhosis: EtOH cirrhosis.  MELD 3.0 with 18 on 03/12/2022.  This was while recovering from an acute alcoholic hepatitis event.  Currently without symptoms of decompensation but is reporting some recurrent upper abdominal discomfort/pressure as well as some scleral icterus, a query whether he has recurrent alcoholic hepatitis in the setting of ongoing daily alcohol use.  Reports he is working on this, taking Campral and seeing an Chartered certified accountant.  Ultrasound is up-to-date in December 2023 without focal liver lesion.  EGD up-to-date March 2023 without varices. Full serologic evaluation completed previously.  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  Suspect elevated iron level/ferritin likely secondary to chronic alcohol use.  We are updating iron panel today.  If persistent elevation, could refer to hematology as previously recommended.    GERD: Chronic.  Well-controlled on pantoprazole 40 mg twice daily.  No alarm symptoms.   Plan:  CBC, CMP, INR, lipase, iron panel Counseled on the importance of strict alcohol abstinence. Continue pantoprazole 40 mg twice daily. Needs Hep A/B vaccination. Will need to provide Rx at follow-up visit.  Further recommendations pending lab results.   Aliene Altes, PA-C [facility] Gastroenterology 05/28/2022

## 2022-05-27 DIAGNOSIS — F1120 Opioid dependence, uncomplicated: Secondary | ICD-10-CM | POA: Diagnosis not present

## 2022-05-28 ENCOUNTER — Encounter: Payer: Self-pay | Admitting: Gastroenterology

## 2022-05-28 ENCOUNTER — Ambulatory Visit: Payer: BC Managed Care – PPO | Admitting: Internal Medicine

## 2022-05-28 ENCOUNTER — Ambulatory Visit (INDEPENDENT_AMBULATORY_CARE_PROVIDER_SITE_OTHER): Payer: BC Managed Care – PPO | Admitting: Gastroenterology

## 2022-05-28 VITALS — BP 113/78 | HR 79 | Temp 98.4°F | Ht 70.5 in | Wt 210.6 lb

## 2022-05-28 DIAGNOSIS — K7010 Alcoholic hepatitis without ascites: Secondary | ICD-10-CM | POA: Diagnosis not present

## 2022-05-28 DIAGNOSIS — R1010 Upper abdominal pain, unspecified: Secondary | ICD-10-CM | POA: Insufficient documentation

## 2022-05-28 DIAGNOSIS — K7030 Alcoholic cirrhosis of liver without ascites: Secondary | ICD-10-CM | POA: Diagnosis not present

## 2022-05-28 DIAGNOSIS — K219 Gastro-esophageal reflux disease without esophagitis: Secondary | ICD-10-CM | POA: Diagnosis not present

## 2022-05-28 DIAGNOSIS — R6881 Early satiety: Secondary | ICD-10-CM

## 2022-05-28 NOTE — Patient Instructions (Addendum)
Please have blood work completed at [facility].  We will call you with results and further recommendations.  As we discussed, it is imperative to avoid all alcohol moving forward.  Follow-up date to determined.  Aliene Altes, PA-C [facility] Gastroenterology

## 2022-05-29 ENCOUNTER — Other Ambulatory Visit: Payer: Self-pay | Admitting: Gastroenterology

## 2022-05-29 DIAGNOSIS — K859 Acute pancreatitis without necrosis or infection, unspecified: Secondary | ICD-10-CM | POA: Diagnosis not present

## 2022-05-29 DIAGNOSIS — F1120 Opioid dependence, uncomplicated: Secondary | ICD-10-CM | POA: Diagnosis not present

## 2022-05-29 DIAGNOSIS — K7010 Alcoholic hepatitis without ascites: Secondary | ICD-10-CM | POA: Diagnosis not present

## 2022-05-29 DIAGNOSIS — R112 Nausea with vomiting, unspecified: Secondary | ICD-10-CM

## 2022-05-29 DIAGNOSIS — F17200 Nicotine dependence, unspecified, uncomplicated: Secondary | ICD-10-CM | POA: Diagnosis not present

## 2022-05-29 LAB — CBC WITH DIFFERENTIAL/PLATELET
Absolute Monocytes: 493 cells/uL (ref 200–950)
Basophils Absolute: 39 cells/uL (ref 0–200)
Basophils Relative: 0.7 %
Eosinophils Absolute: 330 cells/uL (ref 15–500)
Eosinophils Relative: 5.9 %
HCT: 46.9 % (ref 38.5–50.0)
Hemoglobin: 15.6 g/dL (ref 13.2–17.1)
Lymphs Abs: 2134 cells/uL (ref 850–3900)
MCH: 28.7 pg (ref 27.0–33.0)
MCHC: 33.3 g/dL (ref 32.0–36.0)
MCV: 86.2 fL (ref 80.0–100.0)
MPV: 11.7 fL (ref 7.5–12.5)
Monocytes Relative: 8.8 %
Neutro Abs: 2604 cells/uL (ref 1500–7800)
Neutrophils Relative %: 46.5 %
Platelets: 80 10*3/uL — ABNORMAL LOW (ref 140–400)
RBC: 5.44 10*6/uL (ref 4.20–5.80)
RDW: 12.8 % (ref 11.0–15.0)
Total Lymphocyte: 38.1 %
WBC: 5.6 10*3/uL (ref 3.8–10.8)

## 2022-05-29 LAB — COMPLETE METABOLIC PANEL WITH GFR
AG Ratio: 1 (calc) (ref 1.0–2.5)
ALT: 48 U/L — ABNORMAL HIGH (ref 9–46)
AST: 125 U/L — ABNORMAL HIGH (ref 10–40)
Albumin: 3.6 g/dL (ref 3.6–5.1)
Alkaline phosphatase (APISO): 169 U/L — ABNORMAL HIGH (ref 36–130)
BUN/Creatinine Ratio: 7 (calc) (ref 6–22)
BUN: 5 mg/dL — ABNORMAL LOW (ref 7–25)
CO2: 28 mmol/L (ref 20–32)
Calcium: 9 mg/dL (ref 8.6–10.3)
Chloride: 101 mmol/L (ref 98–110)
Creat: 0.67 mg/dL (ref 0.60–1.26)
Globulin: 3.7 g/dL (calc) (ref 1.9–3.7)
Glucose, Bld: 91 mg/dL (ref 65–99)
Potassium: 4.1 mmol/L (ref 3.5–5.3)
Sodium: 140 mmol/L (ref 135–146)
Total Bilirubin: 2.7 mg/dL — ABNORMAL HIGH (ref 0.2–1.2)
Total Protein: 7.3 g/dL (ref 6.1–8.1)
eGFR: 129 mL/min/{1.73_m2} (ref >=60)

## 2022-05-29 LAB — PROTIME-INR
INR: 1.2 — ABNORMAL HIGH
Prothrombin Time: 12.4 s — ABNORMAL HIGH (ref 9.0–11.5)

## 2022-05-29 LAB — IRON,TIBC AND FERRITIN PANEL
%SAT: 30 % (calc) (ref 20–48)
Ferritin: 44 ng/mL (ref 38–380)
Iron: 124 ug/dL (ref 50–180)
TIBC: 413 mcg/dL (calc) (ref 250–425)

## 2022-05-29 LAB — LIPASE: Lipase: 11 U/L (ref 7–60)

## 2022-05-29 MED ORDER — ONDANSETRON HCL 4 MG PO TABS: 4.0000 mg | ORAL_TABLET | Freq: Three times a day (TID) | ORAL | 0 refills | Status: DC | PRN

## 2022-06-22 DIAGNOSIS — F1120 Opioid dependence, uncomplicated: Secondary | ICD-10-CM | POA: Diagnosis not present

## 2022-06-22 DIAGNOSIS — K7010 Alcoholic hepatitis without ascites: Secondary | ICD-10-CM | POA: Diagnosis not present

## 2022-06-22 DIAGNOSIS — K859 Acute pancreatitis without necrosis or infection, unspecified: Secondary | ICD-10-CM | POA: Diagnosis not present

## 2022-06-22 DIAGNOSIS — F17200 Nicotine dependence, unspecified, uncomplicated: Secondary | ICD-10-CM | POA: Diagnosis not present

## 2022-06-30 DIAGNOSIS — F17200 Nicotine dependence, unspecified, uncomplicated: Secondary | ICD-10-CM | POA: Diagnosis not present

## 2022-06-30 DIAGNOSIS — K859 Acute pancreatitis without necrosis or infection, unspecified: Secondary | ICD-10-CM | POA: Diagnosis not present

## 2022-06-30 DIAGNOSIS — K7010 Alcoholic hepatitis without ascites: Secondary | ICD-10-CM | POA: Diagnosis not present

## 2022-06-30 DIAGNOSIS — F1120 Opioid dependence, uncomplicated: Secondary | ICD-10-CM | POA: Diagnosis not present

## 2022-07-09 DIAGNOSIS — F1120 Opioid dependence, uncomplicated: Secondary | ICD-10-CM | POA: Diagnosis not present

## 2022-07-09 DIAGNOSIS — K7010 Alcoholic hepatitis without ascites: Secondary | ICD-10-CM | POA: Diagnosis not present

## 2022-07-09 DIAGNOSIS — F17200 Nicotine dependence, unspecified, uncomplicated: Secondary | ICD-10-CM | POA: Diagnosis not present

## 2022-07-09 DIAGNOSIS — K859 Acute pancreatitis without necrosis or infection, unspecified: Secondary | ICD-10-CM | POA: Diagnosis not present

## 2022-07-15 DIAGNOSIS — K859 Acute pancreatitis without necrosis or infection, unspecified: Secondary | ICD-10-CM | POA: Diagnosis not present

## 2022-07-15 DIAGNOSIS — K7010 Alcoholic hepatitis without ascites: Secondary | ICD-10-CM | POA: Diagnosis not present

## 2022-07-15 DIAGNOSIS — F1120 Opioid dependence, uncomplicated: Secondary | ICD-10-CM | POA: Diagnosis not present

## 2022-07-15 DIAGNOSIS — F17200 Nicotine dependence, unspecified, uncomplicated: Secondary | ICD-10-CM | POA: Diagnosis not present

## 2022-07-17 DIAGNOSIS — F1120 Opioid dependence, uncomplicated: Secondary | ICD-10-CM | POA: Diagnosis not present

## 2022-07-17 DIAGNOSIS — K7010 Alcoholic hepatitis without ascites: Secondary | ICD-10-CM | POA: Diagnosis not present

## 2022-07-23 DIAGNOSIS — K7010 Alcoholic hepatitis without ascites: Secondary | ICD-10-CM | POA: Diagnosis not present

## 2022-07-23 DIAGNOSIS — K859 Acute pancreatitis without necrosis or infection, unspecified: Secondary | ICD-10-CM | POA: Diagnosis not present

## 2022-07-23 DIAGNOSIS — F17200 Nicotine dependence, unspecified, uncomplicated: Secondary | ICD-10-CM | POA: Diagnosis not present

## 2022-07-23 DIAGNOSIS — F1120 Opioid dependence, uncomplicated: Secondary | ICD-10-CM | POA: Diagnosis not present

## 2022-07-24 DIAGNOSIS — K7010 Alcoholic hepatitis without ascites: Secondary | ICD-10-CM | POA: Diagnosis not present

## 2022-07-24 DIAGNOSIS — K769 Liver disease, unspecified: Secondary | ICD-10-CM | POA: Diagnosis not present

## 2022-07-24 DIAGNOSIS — F1120 Opioid dependence, uncomplicated: Secondary | ICD-10-CM | POA: Diagnosis not present

## 2022-08-04 DIAGNOSIS — F1120 Opioid dependence, uncomplicated: Secondary | ICD-10-CM | POA: Diagnosis not present

## 2022-08-05 DIAGNOSIS — K7010 Alcoholic hepatitis without ascites: Secondary | ICD-10-CM | POA: Diagnosis not present

## 2022-08-05 DIAGNOSIS — F17200 Nicotine dependence, unspecified, uncomplicated: Secondary | ICD-10-CM | POA: Diagnosis not present

## 2022-08-05 DIAGNOSIS — K859 Acute pancreatitis without necrosis or infection, unspecified: Secondary | ICD-10-CM | POA: Diagnosis not present

## 2022-08-05 DIAGNOSIS — F1120 Opioid dependence, uncomplicated: Secondary | ICD-10-CM | POA: Diagnosis not present

## 2022-08-07 DIAGNOSIS — F1120 Opioid dependence, uncomplicated: Secondary | ICD-10-CM | POA: Diagnosis not present

## 2022-08-07 DIAGNOSIS — K7010 Alcoholic hepatitis without ascites: Secondary | ICD-10-CM | POA: Diagnosis not present

## 2022-08-18 DIAGNOSIS — F1120 Opioid dependence, uncomplicated: Secondary | ICD-10-CM | POA: Diagnosis not present

## 2022-08-18 DIAGNOSIS — F17200 Nicotine dependence, unspecified, uncomplicated: Secondary | ICD-10-CM | POA: Diagnosis not present

## 2022-08-18 DIAGNOSIS — K7010 Alcoholic hepatitis without ascites: Secondary | ICD-10-CM | POA: Diagnosis not present

## 2022-08-18 DIAGNOSIS — K859 Acute pancreatitis without necrosis or infection, unspecified: Secondary | ICD-10-CM | POA: Diagnosis not present

## 2022-12-22 ENCOUNTER — Other Ambulatory Visit: Payer: Self-pay | Admitting: Internal Medicine

## 2022-12-22 NOTE — Telephone Encounter (Signed)
Baxter Hire seen pt last

## 2023-02-25 ENCOUNTER — Emergency Department (HOSPITAL_COMMUNITY)
Admission: EM | Admit: 2023-02-25 | Discharge: 2023-02-26 | Disposition: A | Discharge status: Home / Self Care | Payer: 59 | Attending: Emergency Medicine | Admitting: Emergency Medicine

## 2023-02-25 ENCOUNTER — Other Ambulatory Visit: Payer: Self-pay

## 2023-02-25 ENCOUNTER — Encounter (HOSPITAL_COMMUNITY): Payer: Self-pay | Admitting: Emergency Medicine

## 2023-02-25 ENCOUNTER — Emergency Department (HOSPITAL_COMMUNITY): Payer: 59

## 2023-02-25 DIAGNOSIS — M79605 Pain in left leg: Secondary | ICD-10-CM | POA: Diagnosis not present

## 2023-02-25 DIAGNOSIS — R748 Abnormal levels of other serum enzymes: Secondary | ICD-10-CM | POA: Diagnosis not present

## 2023-02-25 DIAGNOSIS — R252 Cramp and spasm: Secondary | ICD-10-CM

## 2023-02-25 DIAGNOSIS — J029 Acute pharyngitis, unspecified: Secondary | ICD-10-CM | POA: Diagnosis not present

## 2023-02-25 DIAGNOSIS — R079 Chest pain, unspecified: Secondary | ICD-10-CM | POA: Insufficient documentation

## 2023-02-25 NOTE — ED Notes (Signed)
ED Provider at bedside. 

## 2023-02-25 NOTE — ED Triage Notes (Signed)
Pt c/o center chest pain intermittently x 2 weeks, had an episode tonight that made him SOB lasting 60-90 seconds with chills

## 2023-02-25 NOTE — ED Notes (Signed)
Patient transported to X-ray 

## 2023-02-26 LAB — BASIC METABOLIC PANEL
Anion gap: 10 (ref 5–15)
BUN: 6 mg/dL (ref 6–20)
CO2: 26 mmol/L (ref 22–32)
Calcium: 8.9 mg/dL (ref 8.9–10.3)
Chloride: 100 mmol/L (ref 98–111)
Creatinine, Ser: 0.72 mg/dL (ref 0.61–1.24)
GFR, Estimated: >60 mL/min (ref >60)
Glucose, Bld: 102 mg/dL — ABNORMAL HIGH (ref 70–99)
Potassium: 3.8 mmol/L (ref 3.5–5.1)
Sodium: 136 mmol/L (ref 135–145)

## 2023-02-26 LAB — GROUP A STREP BY PCR: Group A Strep by PCR: NOT DETECTED

## 2023-02-26 LAB — HEPATIC FUNCTION PANEL
ALT: 45 U/L — ABNORMAL HIGH (ref 0–44)
AST: 82 U/L — ABNORMAL HIGH (ref 15–41)
Albumin: 4 g/dL (ref 3.5–5.0)
Alkaline Phosphatase: 131 U/L — ABNORMAL HIGH (ref 38–126)
Bilirubin, Direct: 0.6 mg/dL — ABNORMAL HIGH (ref 0.0–0.2)
Indirect Bilirubin: 1.2 mg/dL — ABNORMAL HIGH (ref 0.3–0.9)
Total Bilirubin: 1.8 mg/dL — ABNORMAL HIGH (ref <1.2)
Total Protein: 7.4 g/dL (ref 6.5–8.1)

## 2023-02-26 LAB — CBC
HCT: 44 % (ref 39.0–52.0)
Hemoglobin: 15.1 g/dL (ref 13.0–17.0)
MCH: 31.9 pg (ref 26.0–34.0)
MCHC: 34.3 g/dL (ref 30.0–36.0)
MCV: 93 fL (ref 80.0–100.0)
Platelets: 108 10*3/uL — ABNORMAL LOW (ref 150–400)
RBC: 4.73 MIL/uL (ref 4.22–5.81)
RDW: 15 % (ref 11.5–15.5)
WBC: 5 10*3/uL (ref 4.0–10.5)
nRBC: 0 % (ref 0.0–0.2)

## 2023-02-26 LAB — LIPASE, BLOOD: Lipase: 27 U/L (ref 11–51)

## 2023-02-26 LAB — TROPONIN I (HIGH SENSITIVITY): Troponin I (High Sensitivity): 3 ng/L (ref <18)

## 2023-02-26 MED ORDER — NYSTATIN 100000 UNIT/ML MT SUSP: 500000.0000 [IU] | Freq: Four times a day (QID) | OROMUCOSAL | 0 refills | Status: AC

## 2023-02-26 MED ORDER — DEXAMETHASONE 4 MG PO TABS
10.0000 mg | ORAL_TABLET | Freq: Once | ORAL | Status: AC
Start: 2023-02-26 — End: 2023-02-26
  Administered 2023-02-26: 10 mg via ORAL
  Filled 2023-02-26: qty 3

## 2023-02-26 MED ORDER — NYSTATIN 100000 UNIT/ML MT SUSP
5.0000 mL | Freq: Once | OROMUCOSAL | Status: AC
Start: 2023-02-26 — End: 2023-02-26
  Administered 2023-02-26: 500000 [IU] via ORAL
  Filled 2023-02-26: qty 5

## 2023-02-26 NOTE — ED Provider Notes (Signed)
[facility] Provider Note   CSN: [id number] Arrival date & time: 02/25/23  2257     History Chief Complaint  Patient presents with   Chest Pain    Jake King is a 32 y.o. male.  32 yo M w/ chest pain.  Patient states that he went to stand up and had left leg cramp.  He worked it out and had a right leg cramping shortly after that he started having some pain in his chest.  He thinks is probably muscular but his mother had a heart attack when she was 30 something and he is worried about that.  Also smokes and has a daily drinking history of cirrhosis.  No shortness of breath, nausea, vomiting, diaphoresis, lightheadedness with the symptoms today.  No lower extremity swelling recently.   Chest Pain      Home Medications Prior to Admission medications   Medication Sig Start Date End Date Taking? Authorizing Provider  nystatin (MYCOSTATIN) 100000 UNIT/ML suspension Take 5 mLs (500,000 Units total) by mouth 4 (four) times daily for 7 days. Swish, gargle and swallow four times a day for seven days. 02/26/23 03/05/23 Yes Mane Consolo, Barbara Cower, MD  acamprosate (CAMPRAL) 333 MG tablet Take 666 mg by mouth 3 (three) times daily. 01/19/22   [provider]  albuterol (VENTOLIN HFA) 108 (90 Base) MCG/ACT inhaler Inhale 2 puffs into the lungs every 4 (four) hours as needed for wheezing or shortness of breath. 05/01/21   Anabel Halon, MD  b complex vitamins capsule Take 1 capsule by mouth daily.    [provider]  Buprenorphine HCl-Naloxone HCl 8-2 MG FILM Place 0.25 Film under the tongue daily. 01/01/22   [provider]  busPIRone (BUSPAR) 10 MG tablet Take 10 mg by mouth 3 (three) times daily. 12/18/21   [provider]  folic acid (FOLVITE) 1 MG tablet Take 1 mg by mouth daily. 03/05/22   [provider]  gabapentin (NEURONTIN) 100 MG capsule Take 100 mg by mouth 3 (three) times daily. 05/14/22    [provider]  Multiple Vitamin (MULTIVITAMIN) tablet Take 1 tablet by mouth daily.    [provider]  ondansetron (ZOFRAN) 4 MG tablet Take 1 tablet (4 mg total) by mouth every 8 (eight) hours as needed for nausea or vomiting. 05/29/22   Letta Median, PA-C  pantoprazole (PROTONIX) 40 MG tablet Take 1 tablet (40 mg total) by mouth 2 (two) times daily before a meal. 12/23/22   Letta Median, PA-C  prednisoLONE acetate (PRED FORTE) 1 % ophthalmic suspension Place 1 drop into both eyes 4 (four) times daily. 12/09/21   [provider]  QUEtiapine (SEROQUEL) 25 MG tablet Take 25 mg by mouth at bedtime. 01/02/22   [provider]  Vitamin D-Vitamin K (VITAMIN K2-VITAMIN D3 PO) Take by mouth.    [provider]      Allergies    Atropine and Other    Review of Systems   Review of Systems  Cardiovascular:  Positive for chest pain.    Physical Exam Updated Vital Signs BP 131/89   Pulse 64   Temp 98.5 F (36.9 C) (Oral)   Resp 14   Ht 5' 10.5" (1.791 m)   Wt 99.8 kg   SpO2 96%   BMI 31.12 kg/m  Physical Exam Vitals and nursing note reviewed.  Constitutional:      Appearance: He is well-developed.  HENT:  Head: Normocephalic and atraumatic.  Cardiovascular:     Rate and Rhythm: Normal rate.  Pulmonary:     Effort: Pulmonary effort is normal. No respiratory distress.  Abdominal:     General: There is no distension.  Musculoskeletal:        General: Normal range of motion.     Cervical back: Normal range of motion.  Neurological:     Mental Status: He is alert.     ED Results / Procedures / Treatments   Labs (all labs ordered are listed, but only abnormal results are displayed) Labs Reviewed  BASIC METABOLIC PANEL - Abnormal; Notable for the following components:      Result Value   Glucose, Bld 102 (*)    All other components within normal limits  CBC - Abnormal; Notable for the following components:   Platelets  108 (*)    All other components within normal limits  HEPATIC FUNCTION PANEL - Abnormal; Notable for the following components:   AST 82 (*)    ALT 45 (*)    Alkaline Phosphatase 131 (*)    Total Bilirubin 1.8 (*)    Bilirubin, Direct 0.6 (*)    Indirect Bilirubin 1.2 (*)    All other components within normal limits  GROUP A STREP BY PCR  LIPASE, BLOOD  TROPONIN I (HIGH SENSITIVITY)    EKG EKG Interpretation Date/Time:  Thursday February 25 2023 23:06:42 EST Ventricular Rate:  70 PR Interval:  176 QRS Duration:  95 QT Interval:  389 QTC Calculation: 420 R Axis:   83  Text Interpretation: Sinus rhythm ST elev, probable normal early repol pattern Baseline wander in lead(s) II III aVF Confirmed by Marily Memos [phone]) on 02/25/2023 11:31:07 PM  Radiology DG Chest 2 View Result Date: 02/25/2023 CLINICAL DATA:  Chest pain. EXAM: CHEST - 2 VIEW COMPARISON:  March 04, 2020 FINDINGS: The heart size and mediastinal contours are within normal limits. Both lungs are clear. The visualized skeletal structures are unremarkable. IMPRESSION: No active cardiopulmonary disease. Electronically Signed   By: Aram Candela M.D.   On: 02/25/2023 23:49    Procedures Procedures    Medications Ordered in ED Medications  dexamethasone (DECADRON) tablet 10 mg (10 mg Oral Given 02/26/23 0202)  nystatin (MYCOSTATIN) 100000 UNIT/ML suspension 500,000 Units (500,000 Units Oral Given 02/26/23 0203)    ED Course/ Medical Decision Making/ A&P                                 Medical Decision Making Amount and/or Complexity of Data Reviewed Labs: ordered. Radiology: ordered.  Risk Prescription drug management.   Workup reassuring. No recurrent symptoms. Will consider quitting drinking and smoking. Fu w/ pcp for further workup/management. No e/o acs, doubt PE. Lungs clear.   Patient stated he had a sore throat and hoarse voice at the time of discharge. A strep test was done and negative.  Does have a small white vesicl on right tonsil with some surrounding erythema. Maybe yeast? Decadron for laryngitis. Nystatin for mouth. Pcp follow up if not imprving in a few days.          Final Clinical Impression(s) / ED Diagnoses Final diagnoses:  Nonspecific chest pain  Leg cramp  Sore throat  Elevated liver enzymes    Rx / DC Orders ED Discharge Orders          Ordered    nystatin (MYCOSTATIN) 100000 UNIT/ML suspension  4 times daily        02/26/23 0154              Percilla Tweten, Barbara Cower, MD 02/26/23 [phone]

## 2023-07-11 ENCOUNTER — Other Ambulatory Visit: Payer: Self-pay | Admitting: Gastroenterology

## 2023-07-12 NOTE — Telephone Encounter (Signed)
 Sending in refills.  Due for office visit.

## 2023-07-13 NOTE — Telephone Encounter (Signed)
 Left a messge asking pt to call back to schedule OV - refills sent but per [facility] to schedule OV

## 2023-12-23 ENCOUNTER — Other Ambulatory Visit: Payer: Self-pay | Admitting: Gastroenterology

## 2024-01-14 ENCOUNTER — Ambulatory Visit: Payer: Self-pay

## 2024-01-14 NOTE — Telephone Encounter (Signed)
 FYI Only or Action Required?: FYI only for provider: appointment scheduled on 11/5.  Patient was last seen in primary care on 11/21/2021 by Paseda, Folashade R, FNP.  Called Nurse Triage reporting Mass and Neck Pain.  Symptoms began several months ago.  Interventions attempted: Nothing.  Symptoms are: stable.  Triage Disposition: See PCP Within 2 Weeks  Patient/caregiver understands and will follow disposition?: Yes Reason for Disposition  Neck pain is a chronic symptom (recurrent or ongoing AND present > 4 weeks)  Answer Assessment - Initial Assessment Questions Feels a hard bump on neck near gland, and swelling around it. Denies difficulty breathing.   1. ONSET: When did the pain begin?      2 months  2. LOCATION: Where does it hurt?      Left side of throat near gland  3. PATTERN Does the pain come and go, or has it been constant since it started?      Constant  4. SEVERITY: How bad is the pain?  (Scale 0-10; or none or slight stiffness, mild, moderate, severe)     Mild to moderate  5. RADIATION: Does the pain go anywhere else, shoot into your arms?     Denies  6. CORD SYMPTOMS: Any weakness or numbness of the arms or legs?     Denies 7. CAUSE: What do you think is causing the neck pain?     Unsure  8. NECK OVERUSE: Any recent activities that involved turning or twisting the neck?     Denies  9. OTHER SYMPTOMS: Do you have any other symptoms? (e.g., headache, fever, chest pain, difficulty breathing, neck swelling)     Was sick with a sinus infection, given abx with 2 days left.  Protocols used: Neck Pain or Stiffness-A-AH  Copied from CRM #[id number]. Topic: Clinical - Red Word Triage >> Jan 14, 2024  9:50 AM Terri MATSU wrote: Red Word that prompted transfer to Nurse Triage: Patient is experiencing pain in neck/throat, painful to move his head, swollen and a hard lump around his glands for a month now.

## 2024-01-19 ENCOUNTER — Encounter: Payer: Self-pay | Admitting: Family Medicine

## 2024-01-19 ENCOUNTER — Ambulatory Visit (INDEPENDENT_AMBULATORY_CARE_PROVIDER_SITE_OTHER): Payer: Self-pay | Admitting: Family Medicine

## 2024-01-19 VITALS — BP 117/82 | HR 73 | Ht 71.0 in | Wt 210.0 lb

## 2024-01-19 DIAGNOSIS — J988 Other specified respiratory disorders: Secondary | ICD-10-CM | POA: Diagnosis not present

## 2024-01-19 DIAGNOSIS — K7030 Alcoholic cirrhosis of liver without ascites: Secondary | ICD-10-CM | POA: Diagnosis not present

## 2024-01-19 NOTE — Assessment & Plan Note (Signed)
Obtaining labs today.  

## 2024-01-19 NOTE — Assessment & Plan Note (Signed)
 Doing well at this time.  Has had recent antibiotic therapy.  There is no appreciable lymphadenopathy.  Obtaining CBC today.

## 2024-01-19 NOTE — Patient Instructions (Signed)
 No appreciable lymphadenopathy. Symptoms should continue to improve given recent antibiotic therapy.  Labs today.  Follow up with your PCP.  Take care  Dr. Bluford

## 2024-01-19 NOTE — Progress Notes (Signed)
 Subjective:  Patient ID: Jake King, male    DOB: 01-21-1991  Age: 33 y.o. MRN: [id number]  CC:   Chief Complaint  Patient presents with   Mass    Lump on neck     HPI:  33 year old male presents for evaluation of the above.  Patient reports that he has recently been sick.  He states that he was sick for at least 12 days.  Recently completed a course of antibiotics.  He states that he had a leftover prescription.  Not sure how he obtained his medication but states that he took 10 days.  He states that his symptoms have improved but he is still having discolored mucus.  He reports a a lump to the left side of his neck.  He would like this to be examined today.  Patient also states that he would like labs to be done.  Has a history of alcoholic cirrhosis.  Patient Active Problem List   Diagnosis Date Noted   Respiratory infection 01/19/2024   Opioid dependence (HCC) 03/04/2022   Alcoholic cirrhosis of liver (HCC) 03/03/2022   Anxiety 03/03/2022   Portal venous hypertension (HCC)    Allergic rhinitis 09/06/2021   Insomnia 07/15/2021   Hyperbilirubinemia    Thrombocytopenia 07/11/2021   GERD (gastroesophageal reflux disease) 05/15/2021   Alcohol dependence with other alcohol-induced disorder (HCC) 05/02/2021   Mixed hyperlipidemia 05/02/2021   Tobacco abuse 05/01/2021   Intractable migraine with aura without status migrainosus 03/04/2020   Vitamin D deficiency 03/04/2020   Hypertension 03/04/2020    Social Hx   Social History   Socioeconomic History   Marital status: Significant Other    Spouse name: Rosina   Number of children: 1   Years of education: GED   Highest education level: Not on file  Occupational History   Occupation: [employer]  Tobacco Use   Smoking status: Every Day    Current packs/day: 0.50    Types: Cigarettes    Passive exposure: Current   Smokeless tobacco: Former  Building Services Engineer status: Never Used  Substance and  Sexual Activity   Alcohol use: Yes    Alcohol/week: 56.0 - 70.0 standard drinks of alcohol    Types: 56 - 70 Cans of beer per week    Comment: daily 2 beers   Drug use: Not Currently    Comment: no drugs since 2018   Sexual activity: Yes  Other Topics Concern   Not on file  Social History Narrative   Lives with ELINORE Edwardo) and their kids   Caffeine use: none   Right handed    Social Drivers of Health   Financial Resource Strain: Not on file  Food Insecurity: No Food Insecurity (03/02/2022)   Hunger Vital Sign    Worried About Running Out of Food in the Last Year: Never true    Ran Out of Food in the Last Year: Never true  Transportation Needs: No Transportation Needs (03/02/2022)   PRAPARE - Administrator, Civil Service (Medical): No    Lack of Transportation (Non-Medical): No  Physical Activity: Not on file  Stress: Not on file  Social Connections: Unknown (07/15/2021)   [facility]   Social Network    Social Network: Not on file    Review of Systems Per HPI  Objective:  BP 117/82   Pulse 73   Ht 5' 11 (1.803 m)   Wt 210 lb (95.3 kg)   SpO2  98%   BMI 29.29 kg/m      01/19/2024    9:29 AM 02/26/2023    2:00 AM 02/26/2023    1:00 AM  BP/Weight  Systolic BP 117 131 117  Diastolic BP 82 89 81  Wt. (Lbs) 210    BMI 29.29 kg/m2      Physical Exam Constitutional:      General: He is not in acute distress.    Appearance: Normal appearance.  HENT:     Head: Normocephalic and atraumatic.     Mouth/Throat:     Pharynx: Posterior oropharyngeal erythema present. No oropharyngeal exudate.  Neck:     Comments: No appreciable lymphadenopathy or mass on exam. Cardiovascular:     Rate and Rhythm: Normal rate and regular rhythm.  Pulmonary:     Effort: Pulmonary effort is normal.     Breath sounds: Normal breath sounds. No wheezing or rales.  Musculoskeletal:     Cervical back: Neck supple.  Neurological:     Mental Status: He is  alert.     Lab Results  Component Value Date   WBC 5.0 02/26/2023   HGB 15.1 02/26/2023   HCT 44.0 02/26/2023   PLT 108 (L) 02/26/2023   GLUCOSE 102 (H) 02/26/2023   CHOL 180 05/01/2021   TRIG 208 (H) 05/01/2021   HDL 25 (L) 05/01/2021   LDLCALC 118 (H) 05/01/2021   ALT 45 (H) 02/26/2023   AST 82 (H) 02/26/2023   NA 136 02/26/2023   K 3.8 02/26/2023   CL 100 02/26/2023   CREATININE 0.72 02/26/2023   BUN 6 02/26/2023   CO2 26 02/26/2023   TSH 4.266 10/18/2021   INR 1.2 (H) 05/28/2022   HGBA1C 5.2 03/04/2020     Assessment & Plan:  Respiratory infection Assessment & Plan: Doing well at this time.  Has had recent antibiotic therapy.  There is no appreciable lymphadenopathy.  Obtaining CBC today.   Alcoholic cirrhosis of liver without ascites (HCC) Assessment & Plan: Obtaining labs today.  Orders: -     CBC with Differential/Platelet -     CMP14+EGFR    Follow-up: Needs close follow-up with PCP.  Jacqulyn Ahle DO [facility]

## 2024-01-20 ENCOUNTER — Ambulatory Visit: Payer: Self-pay | Admitting: Family Medicine

## 2024-01-20 LAB — CMP14+EGFR
ALT: 28 IU/L (ref 0–44)
AST: 42 IU/L — ABNORMAL HIGH (ref 0–40)
Albumin: 4.4 g/dL (ref 4.1–5.1)
Alkaline Phosphatase: 143 IU/L — ABNORMAL HIGH (ref 47–123)
BUN/Creatinine Ratio: 8 — ABNORMAL LOW (ref 9–20)
BUN: 8 mg/dL (ref 6–20)
Bilirubin Total: 1 mg/dL (ref 0.0–1.2)
CO2: 26 mmol/L (ref 20–29)
Calcium: 9.5 mg/dL (ref 8.7–10.2)
Chloride: 97 mmol/L (ref 96–106)
Creatinine, Ser: 0.98 mg/dL (ref 0.76–1.27)
Globulin, Total: 3.2 g/dL (ref 1.5–4.5)
Glucose: 101 mg/dL — ABNORMAL HIGH (ref 70–99)
Potassium: 4.6 mmol/L (ref 3.5–5.2)
Sodium: 142 mmol/L (ref 134–144)
Total Protein: 7.6 g/dL (ref 6.0–8.5)
eGFR: 106 mL/min/1.73 (ref >59)

## 2024-01-20 LAB — CBC WITH DIFFERENTIAL/PLATELET
Basophils Absolute: 0 x10E3/uL (ref 0.0–0.2)
Basos: 0 %
EOS (ABSOLUTE): 0.2 x10E3/uL (ref 0.0–0.4)
Eos: 5 %
Hematocrit: 56 % — ABNORMAL HIGH (ref 37.5–51.0)
Hemoglobin: 18.8 g/dL — ABNORMAL HIGH (ref 13.0–17.7)
Immature Grans (Abs): 0 x10E3/uL (ref 0.0–0.1)
Immature Granulocytes: 0 %
Lymphocytes Absolute: 2 x10E3/uL (ref 0.7–3.1)
Lymphs: 41 %
MCH: 31.3 pg (ref 26.6–33.0)
MCHC: 33.6 g/dL (ref 31.5–35.7)
MCV: 93 fL (ref 79–97)
Monocytes Absolute: 0.3 x10E3/uL (ref 0.1–0.9)
Monocytes: 6 %
Neutrophils Absolute: 2.3 x10E3/uL (ref 1.4–7.0)
Neutrophils: 48 %
Platelets: 101 x10E3/uL — ABNORMAL LOW (ref 150–450)
RBC: 6.01 x10E6/uL — ABNORMAL HIGH (ref 4.14–5.80)
RDW: 13.8 % (ref 11.6–15.4)
WBC: 4.8 x10E3/uL (ref 3.4–10.8)

## 2024-02-19 ENCOUNTER — Other Ambulatory Visit: Payer: Self-pay | Admitting: Gastroenterology

## 2024-02-28 ENCOUNTER — Other Ambulatory Visit: Payer: Self-pay | Admitting: Internal Medicine

## 2024-02-28 NOTE — Telephone Encounter (Unsigned)
 Copied from CRM #[id number]. Topic: Clinical - Medication Refill >> Feb 28, 2024  9:53 AM Victoria A wrote: Medication: pantoprazole  (PROTONIX ) 40 MG tablet  Has the patient contacted their pharmacy? No (Agent: If no, request that the patient contact the pharmacy for the refill. If patient does not wish to contact the pharmacy document the reason why and proceed with request.) (Agent: If yes, when and what did the pharmacy advise?)  This is the patient's preferred pharmacy:  [facility] [address], [place] - [address] [place] [address] Phone: [phone] Fax: [phone]  Is this the correct pharmacy for this prescription? Yes If no, delete pharmacy and type the correct one.   Has the prescription been filled recently? No  Is the patient out of the medication? Yes  Has the patient been seen for an appointment in the last year OR does the patient have an upcoming appointment? Yes  Can we respond through MyChart? Yes  Agent: Please be advised that Rx refills may take up to 3 business days. We ask that you follow-up with your pharmacy.

## 2024-02-29 MED ORDER — PANTOPRAZOLE SODIUM 40 MG PO TBEC: 40.0000 mg | DELAYED_RELEASE_TABLET | Freq: Two times a day (BID) | ORAL | 0 refills | Status: DC

## 2024-03-20 ENCOUNTER — Ambulatory Visit: Admitting: Internal Medicine

## 2024-03-20 ENCOUNTER — Encounter: Payer: Self-pay | Admitting: Internal Medicine

## 2024-03-20 VITALS — BP 117/77 | HR 86 | Ht 71.0 in | Wt 217.0 lb

## 2024-03-20 DIAGNOSIS — D696 Thrombocytopenia, unspecified: Secondary | ICD-10-CM

## 2024-03-20 DIAGNOSIS — F1129 Opioid dependence with unspecified opioid-induced disorder: Secondary | ICD-10-CM

## 2024-03-20 DIAGNOSIS — R221 Localized swelling, mass and lump, neck: Secondary | ICD-10-CM | POA: Diagnosis not present

## 2024-03-20 DIAGNOSIS — E559 Vitamin D deficiency, unspecified: Secondary | ICD-10-CM

## 2024-03-20 DIAGNOSIS — F331 Major depressive disorder, recurrent, moderate: Secondary | ICD-10-CM | POA: Diagnosis not present

## 2024-03-20 DIAGNOSIS — L819 Disorder of pigmentation, unspecified: Secondary | ICD-10-CM | POA: Insufficient documentation

## 2024-03-20 DIAGNOSIS — E291 Testicular hypofunction: Secondary | ICD-10-CM | POA: Insufficient documentation

## 2024-03-20 DIAGNOSIS — D751 Secondary polycythemia: Secondary | ICD-10-CM

## 2024-03-20 DIAGNOSIS — K7030 Alcoholic cirrhosis of liver without ascites: Secondary | ICD-10-CM | POA: Diagnosis not present

## 2024-03-20 NOTE — Patient Instructions (Signed)
 Please continue to take medications as prescribed.  Please continue to follow heart healthy diet and perform moderate exercise/walking at least 150 mins/week.

## 2024-03-20 NOTE — Progress Notes (Signed)
 "  Established Patient Office Visit  Subjective:  Patient ID: Jake King, male    DOB: 1990-04-03  Age: 34 y.o. MRN: [id number]  CC:  Chief Complaint  Patient presents with   Neck Pain    Pt reports sx of lump on left side of his neck, states it went away last week.    Skin Problem    Pt reports skin pigmentation     HPI Jake King is a 34 y.o. male with past medical history of migraine, chronic leg weakness, HLD and alcohol hepatitis who presents for f/u of his chronic medical conditions.  He had cut down alcohol use after being discharged from the hospital in 12/23, takes beers occasionally.  He used to be followed by GI for alcoholic cirrhosis.  Of note, he also takes pantoprazole  for GERD/gastritis.  He is currently on Suboxone  treatment for opioid dependence.  He also takes Seroquel  for insomnia.  Currently followed by addiction medicine clinic in [place].  He was evaluated by Dr. Bluford in 11/25 for neck bump on left side.  He had a sudden onset left-sided neck bump, which has resolved slowly.  He was initially having painful swallowing, which has also improved now.  Denies any recent fever, chills, but reports having possible URTI like symptoms prior to noticing the neck bump.  Had CBC, which showed normal WBC count, but elevated RBCs and Hb (18.8).  Past Medical History:  Diagnosis Date   Brain mass    pineal cyst   Cirrhosis (HCC)    Fatty liver    Hepatitis    Hypertension    Migraine    Right knee injury    Seizures (HCC)    Vitamin D  deficiency     Past Surgical History:  Procedure Laterality Date   BIOPSY  06/10/2021   Procedure: BIOPSY;  Surgeon: Cindie Carlin POUR, DO;  [facility];  Service: Endoscopy;;   ESOPHAGOGASTRODUODENOSCOPY (EGD) WITH PROPOFOL  N/A 06/10/2021   Procedure: ESOPHAGOGASTRODUODENOSCOPY (EGD) WITH PROPOFOL ;  Surgeon: Cindie Carlin POUR, DO;  [facility];  Service: Endoscopy;  Laterality: N/A;   11:15am, ASA 2   NASAL SINUS SURGERY     NASAL SINUS SURGERY  2009    Family History  Problem Relation Age of Onset   Other Mother        Twisted Colon   Mental illness Mother    Diabetes Father    Hypertension Father    Pneumonia Father    Liver disease Father        unsure what   Ulcerative colitis Paternal Grandmother    Colon polyps Paternal Grandmother    Congestive Heart Failure Paternal Grandfather    Hypertension Paternal Grandfather    Diabetes Paternal Grandfather    Multiple sclerosis Paternal Grandfather    Colon cancer Neg Hx     Social History   Socioeconomic History   Marital status: Significant Other    Spouse name: Rosina   Number of children: 1   Years of education: GED   Highest education level: Not on file  Occupational History   Occupation: [employer]  Tobacco Use   Smoking status: Every Day    Current packs/day: 0.50    Types: Cigarettes    Passive exposure: Current   Smokeless tobacco: Former  Building Services Engineer status: Never Used  Substance and Sexual Activity   Alcohol use: Yes    Alcohol/week: 56.0 - 70.0 standard drinks of alcohol  Types: 56 - 70 Cans of beer per week    Comment: Jake 2 beers   Drug use: Not Currently    Comment: no drugs since 2018   Sexual activity: Yes  Other Topics Concern   Not on file  Social History Narrative   Lives with ELINORE Edwardo) and their kids   Caffeine use: none   Right handed    Social Drivers of Health   Tobacco Use: High Risk (03/20/2024)   Patient History    Smoking Tobacco Use: Every Day    Smokeless Tobacco Use: Former    Passive Exposure: Current  Programmer, Applications: Not on file  Food Insecurity: No Food Insecurity (03/02/2022)   Hunger Vital Sign    Worried About Running Out of Food in the Last Year: Never true    Ran Out of Food in the Last Year: Never true  Transportation Needs: No Transportation Needs (03/02/2022)   PRAPARE - Scientist, Research (physical Sciences) (Medical): No    Lack of Transportation (Non-Medical): No  Physical Activity: Not on file  Stress: Not on file  Social Connections: Unknown (07/15/2021)   Received from [facility]   Social Network    Social Network: Not on file  Intimate Partner Violence: Not At Risk (03/02/2022)   Humiliation, Afraid, Rape, and Kick questionnaire    Fear of Current or Ex-Partner: No    Emotionally Abused: No    Physically Abused: No    Sexually Abused: No  Depression (PHQ2-9): High Risk (01/19/2024)   Depression (PHQ2-9)    PHQ-2 Score: 11  Alcohol Screen: Not on file  Housing: Low Risk (03/02/2022)   Housing    Last Housing Risk Score: 0  Utilities: Not At Risk (03/02/2022)   AHC Utilities    Threatened with loss of utilities: No  Health Literacy: Not on file    Outpatient Medications Prior to Visit  Medication Sig Dispense Refill   albuterol  (VENTOLIN  HFA) 108 (90 Base) MCG/ACT inhaler Inhale 2 puffs into the lungs every 4 (four) hours as needed for wheezing or shortness of breath. 18 g 5   b complex vitamins capsule Take 1 capsule by mouth Jake.     Buprenorphine  HCl-Naloxone  HCl 8-2 MG FILM Place 0.25 Film under the tongue Jake.     folic acid  (FOLVITE ) 1 MG tablet Take 1 mg by mouth Jake.     Multiple Vitamin (MULTIVITAMIN) tablet Take 1 tablet by mouth Jake.     pantoprazole  (PROTONIX ) 40 MG tablet Take 1 tablet (40 mg total) by mouth 2 (two) times Jake before a meal. 60 tablet 0   QUEtiapine  (SEROQUEL ) 25 MG tablet Take 25 mg by mouth at bedtime.     Vitamin D -Vitamin K (VITAMIN K2-VITAMIN D3 PO) Take by mouth.     No facility-administered medications prior to visit.    Allergies  Allergen Reactions   Atropine Swelling    Eye Drops from when he was a baby   Other Swelling    SULFITES IN ALCOHOLS     ROS Review of Systems  Constitutional:  Negative for chills and fever.  HENT:  Negative for congestion and sore throat.   Eyes:  Negative for discharge and  redness.  Respiratory:  Negative for cough and shortness of breath.   Cardiovascular:  Negative for chest pain and palpitations.  Gastrointestinal:  Negative for diarrhea, nausea and vomiting.  Endocrine: Negative for polydipsia and polyuria.  Genitourinary:  Negative for dysuria and hematuria.  Musculoskeletal:  Negative for neck pain and neck stiffness.  Skin:  Negative for rash.  Neurological:  Negative for dizziness and numbness.  Psychiatric/Behavioral:  Negative for agitation and behavioral problems.       Objective:    Physical Exam Vitals reviewed.  Constitutional:      General: He is not in acute distress.    Appearance: He is not diaphoretic.  HENT:     Head: Normocephalic and atraumatic.     Nose: No congestion.     Mouth/Throat:     Mouth: Mucous membranes are moist.     Pharynx: No oropharyngeal exudate.  Eyes:     General: No scleral icterus.    Extraocular Movements: Extraocular movements intact.  Cardiovascular:     Rate and Rhythm: Normal rate and regular rhythm.     Heart sounds: Normal heart sounds. No murmur heard. Pulmonary:     Breath sounds: Normal breath sounds. No wheezing or rales.  Musculoskeletal:     Cervical back: Neck supple. No tenderness.     Right lower leg: No edema.     Left lower leg: No edema.  Lymphadenopathy:     Cervical: No cervical adenopathy.  Skin:    General: Skin is warm.     Findings: No rash.  Neurological:     General: No focal deficit present.     Mental Status: He is alert and oriented to person, place, and time.  Psychiatric:        Mood and Affect: Mood normal.        Behavior: Behavior normal.     BP 117/77   Pulse 86   Ht 5' 11 (1.803 m)   Wt 217 lb (98.4 kg)   SpO2 97%   BMI 30.27 kg/m  Wt Readings from Last 3 Encounters:  03/20/24 217 lb (98.4 kg)  01/19/24 210 lb (95.3 kg)  02/25/23 220 lb (99.8 kg)    Lab Results  Component Value Date   TSH 4.266 10/18/2021   Lab Results  Component Value  Date   WBC 4.8 01/19/2024   HGB 18.8 (H) 01/19/2024   HCT 56.0 (H) 01/19/2024   MCV 93 01/19/2024   PLT 101 (L) 01/19/2024   Lab Results  Component Value Date   NA 142 01/19/2024   K 4.6 01/19/2024   CO2 26 01/19/2024   GLUCOSE 101 (H) 01/19/2024   BUN 8 01/19/2024   CREATININE 0.98 01/19/2024   BILITOT 1.0 01/19/2024   ALKPHOS 143 (H) 01/19/2024   AST 42 (H) 01/19/2024   ALT 28 01/19/2024   PROT 7.6 01/19/2024   ALBUMIN 4.4 01/19/2024   CALCIUM 9.5 01/19/2024   ANIONGAP 10 02/26/2023   EGFR 106 01/19/2024   Lab Results  Component Value Date   CHOL 180 05/01/2021   Lab Results  Component Value Date   HDL 25 (L) 05/01/2021   Lab Results  Component Value Date   LDLCALC 118 (H) 05/01/2021   Lab Results  Component Value Date   TRIG 208 (H) 05/01/2021   Lab Results  Component Value Date   CHOLHDL 7.2 (H) 05/01/2021   Lab Results  Component Value Date   HGBA1C 5.2 03/04/2020      Assessment & Plan:   Problem List Items Addressed This Visit       Digestive   Alcoholic cirrhosis of liver (HCC)   Last CMP reviewed, he has slightly elevated AST and ALP Advised to avoid alcohol use Maintain adequate fluid intake  Follow up with GI        Endocrine   Hypogonadism male   Reports chronic fatigue and lack of sexual desire Check total and free testosterone       Relevant Orders   Testosterone ,Free and Total   Estradiol      Hematopoietic and Hemostatic   Thrombocytopenia   Chronic thrombocytopenia, likely due to history of alcohol abuse Denies signs of active bleeding Recheck CBC      Relevant Orders   CBC with Differential/Platelet     Other   Vitamin D  deficiency   Advised to take vitamin D  2000 IU QD      Relevant Orders   Vitamin D  (25 hydroxy)   Opioid dependence (HCC)   Gets Suboxone  through addiction medicine clinic in [place], PDMP reviewed      Moderate episode of recurrent major depressive disorder (HCC)   Overall  well-controlled with Seroquel , also helps with insomnia Followed by addiction medicine clinic in [place]      Relevant Orders   CMP14+EGFR   TSH   Hypopigmentation   Reports having light skin color on the dorsum of bilateral forearms Although appears to be benign, referred to dermatology as per patient request      Relevant Orders   Ambulatory referral to Dermatology   Neck mass   Had sudden onset neck mass, but has resolved now Differentials can include cervical LAD, de Quervain thyroiditis, salivary gland enlargement amongst others Advised to contact if he has recurrent neck mass - will need imaging      Erythrocytosis - Primary   Last CBC showed Hb of 18.8 Could be due to smoking Has thrombocytopenia, which would be due to alcohol use - but on the other side, he has erythrocytosis instead of normocytic anemia We will recheck CBC - if persistently elevated Hb, may need Hematology evaluation        No orders of the defined types were placed in this encounter.   Follow-up: Return in about 6 months (around 09/17/2024).    Suzzane MARLA Blanch, MD "

## 2024-03-21 DIAGNOSIS — D751 Secondary polycythemia: Secondary | ICD-10-CM | POA: Insufficient documentation

## 2024-03-21 DIAGNOSIS — R221 Localized swelling, mass and lump, neck: Secondary | ICD-10-CM | POA: Insufficient documentation

## 2024-03-21 NOTE — Assessment & Plan Note (Signed)
 Reports having light skin color on the dorsum of bilateral forearms Although appears to be benign, referred to dermatology as per patient request

## 2024-03-21 NOTE — Assessment & Plan Note (Signed)
 Overall well-controlled with Seroquel , also helps with insomnia Followed by addiction medicine clinic in [place]

## 2024-03-21 NOTE — Assessment & Plan Note (Signed)
 Reports chronic fatigue and lack of sexual desire Check total and free testosterone 

## 2024-03-21 NOTE — Assessment & Plan Note (Signed)
 Chronic thrombocytopenia, likely due to history of alcohol abuse Denies signs of active bleeding Recheck CBC

## 2024-03-21 NOTE — Assessment & Plan Note (Signed)
 Last CBC showed Hb of 18.8 Could be due to smoking Has thrombocytopenia, which would be due to alcohol use - but on the other side, he has erythrocytosis instead of normocytic anemia We will recheck CBC - if persistently elevated Hb, may need Hematology evaluation

## 2024-03-21 NOTE — Assessment & Plan Note (Signed)
Advised to take vitamin D 2000 IU QD

## 2024-03-21 NOTE — Assessment & Plan Note (Signed)
 Last CMP reviewed, he has slightly elevated AST and ALP Advised to avoid alcohol use Maintain adequate fluid intake Follow up with GI

## 2024-03-21 NOTE — Assessment & Plan Note (Signed)
 Had sudden onset neck mass, but has resolved now Differentials can include cervical LAD, de Quervain thyroiditis, salivary gland enlargement amongst others Advised to contact if he has recurrent neck mass - will need imaging

## 2024-03-21 NOTE — Assessment & Plan Note (Signed)
 Gets Suboxone  through addiction medicine clinic in [place], PDMP reviewed

## 2024-03-24 ENCOUNTER — Ambulatory Visit: Payer: Self-pay | Admitting: Internal Medicine

## 2024-03-24 LAB — CBC WITH DIFFERENTIAL/PLATELET
Basophils Absolute: 0 x10E3/uL (ref 0.0–0.2)
Basos: 1 %
EOS (ABSOLUTE): 0.3 x10E3/uL (ref 0.0–0.4)
Eos: 5 %
Hematocrit: 49.5 % (ref 37.5–51.0)
Hemoglobin: 16.8 g/dL (ref 13.0–17.7)
Immature Grans (Abs): 0 x10E3/uL (ref 0.0–0.1)
Immature Granulocytes: 0 %
Lymphocytes Absolute: 2.7 x10E3/uL (ref 0.7–3.1)
Lymphs: 42 %
MCH: 31.4 pg (ref 26.6–33.0)
MCHC: 33.9 g/dL (ref 31.5–35.7)
MCV: 93 fL (ref 79–97)
Monocytes Absolute: 0.4 x10E3/uL (ref 0.1–0.9)
Monocytes: 6 %
Neutrophils Absolute: 3 x10E3/uL (ref 1.4–7.0)
Neutrophils: 46 %
Platelets: 99 x10E3/uL — CL (ref 150–450)
RBC: 5.35 x10E6/uL (ref 4.14–5.80)
RDW: 13.7 % (ref 11.6–15.4)
WBC: 6.4 x10E3/uL (ref 3.4–10.8)

## 2024-03-24 LAB — CMP14+EGFR
ALT: 27 IU/L (ref 0–44)
AST: 44 IU/L — ABNORMAL HIGH (ref 0–40)
Albumin: 4.3 g/dL (ref 4.1–5.1)
Alkaline Phosphatase: 145 IU/L — ABNORMAL HIGH (ref 47–123)
BUN/Creatinine Ratio: 9 (ref 9–20)
BUN: 9 mg/dL (ref 6–20)
Bilirubin Total: 1.4 mg/dL — ABNORMAL HIGH (ref 0.0–1.2)
CO2: 24 mmol/L (ref 20–29)
Calcium: 9.4 mg/dL (ref 8.7–10.2)
Chloride: 100 mmol/L (ref 96–106)
Creatinine, Ser: 0.97 mg/dL (ref 0.76–1.27)
Globulin, Total: 3 g/dL (ref 1.5–4.5)
Glucose: 88 mg/dL (ref 70–99)
Potassium: 4.4 mmol/L (ref 3.5–5.2)
Sodium: 139 mmol/L (ref 134–144)
Total Protein: 7.3 g/dL (ref 6.0–8.5)
eGFR: 106 mL/min/1.73

## 2024-03-24 LAB — TSH: TSH: 3.05 u[IU]/mL (ref 0.450–4.500)

## 2024-03-24 LAB — VITAMIN D 25 HYDROXY (VIT D DEFICIENCY, FRACTURES): Vit D, 25-Hydroxy: 57.1 ng/mL (ref 30.0–100.0)

## 2024-03-24 LAB — TESTOSTERONE,FREE AND TOTAL
Testosterone, Free: 5.4 pg/mL — ABNORMAL LOW (ref 8.7–25.1)
Testosterone: 382 ng/dL (ref 264–916)

## 2024-03-24 LAB — ESTRADIOL: Estradiol: 14.8 pg/mL (ref 7.6–42.6)

## 2024-03-27 ENCOUNTER — Other Ambulatory Visit: Payer: Self-pay | Admitting: Internal Medicine

## 2024-03-27 DIAGNOSIS — E291 Testicular hypofunction: Secondary | ICD-10-CM

## 2024-04-03 ENCOUNTER — Ambulatory Visit: Admitting: Dermatology

## 2024-04-03 ENCOUNTER — Encounter: Payer: Self-pay | Admitting: Dermatology

## 2024-04-03 DIAGNOSIS — L80 Vitiligo: Secondary | ICD-10-CM | POA: Diagnosis not present

## 2024-04-03 DIAGNOSIS — Z8719 Personal history of other diseases of the digestive system: Secondary | ICD-10-CM | POA: Diagnosis not present

## 2024-04-03 DIAGNOSIS — I781 Nevus, non-neoplastic: Secondary | ICD-10-CM | POA: Diagnosis not present

## 2024-04-03 MED ORDER — TRIAMCINOLONE ACETONIDE 0.1 % EX CREA: 1.0000 | TOPICAL_CREAM | CUTANEOUS | 5 refills | Status: AC

## 2024-04-03 MED ORDER — OPZELURA 1.5 % EX CREA: 1.0000 | TOPICAL_CREAM | CUTANEOUS | 5 refills | Status: DC

## 2024-04-03 NOTE — Patient Instructions (Signed)

## 2024-04-03 NOTE — Progress Notes (Unsigned)
" ° °  New Patient Visit   Subjective  Jake King is a 34 y.o. male who presents for the following: losing pigment on arms ~ 66yr, check spots chest, ~23yrs, itchy in beginning, pt as told r/t liver  New patient referral from Dr. Suzzane Blanch.  The following portions of the chart were reviewed this encounter and updated as appropriate: medications, allergies, medical history  Review of Systems:  No other skin or systemic complaints except as noted in HPI or Assessment and Plan.  Objective  Well appearing patient in no apparent distress; mood and affect are within normal limits.   A focused examination was performed of the following areas: Arms, chest, leg  Relevant exam findings are noted in the Assessment and Plan.  Arms           Assessment & Plan   VITILIGO Viewed TSH results 03/21/24 wnl arms Exam: depigmented patches on arms and dorsal hands  Vitiligo is a chronic autoimmune condition which causes loss of skin pigment and is commonly seen on the face and may also involve areas of trauma like hands, elbows, knees, and ankles. There is no cure and it is difficult to treat.  Treatments include topical steroids and other topical anti-inflammatory ointments/creams and topical and oral Jak inhibitors.  Sometimes narrow band UV light therapy or Xtrac laser is helpful, both of which require twice weekly treatments for at least 3-6 months.  Antioxidant vitamins, such as Vitamins A,C,E,D, Folic Acid  and B12 may be added to enhance treatment. Heliocare may also enhance treatment results.  Treatment Plan: Start TMC 0.1% cr 2 days a week on weekends, avoid f/g/a (patient advised if Opzelura  not covered he can use TMC 0.1% cr 5d/wk) Start Opzelura  cr 5d/wk Monday through Friday to aa arms  Discussed NBUVB Discussed association with autoimmune thyroiditis  Topical steroids (such as triamcinolone , fluocinolone, fluocinonide, mometasone, clobetasol, halobetasol, betamethasone,  hydrocortisone) can cause thinning and lightening of the skin if they are used for too long in the same area. Your physician has selected the right strength medicine for your problem and area affected on the body. Please use your medication only as directed by your physician to prevent side effects.    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  Observation.  Call clinic for new or changing moles.  Recommend daily use of broad spectrum spf 30+ sunscreen to sun-exposed areas.    VITILIGO   This Visit - triamcinolone  cream (KENALOG ) 0.1 % - Apply 1 Application topically as directed. qd to aa arms on weekends only, avoid face, groin, axilla, for vitiligo - Ruxolitinib Phosphate  (OPZELURA ) 1.5 % CREA - Apply 1 Application topically as directed. Apply to arms 5 days a week Monday through Friday for vitiligo SPIDER ANGIOMA    Return in about 6 months (around 10/01/2024) for f/u Vitiligo.  I, Grayce Saunas, RMA, am acting as scribe for Boneta Sharps, MD .   Documentation: I have reviewed the above documentation for accuracy and completeness, and I agree with the above.  Boneta Sharps, MD    "

## 2024-04-06 ENCOUNTER — Other Ambulatory Visit: Payer: Self-pay

## 2024-04-06 DIAGNOSIS — L80 Vitiligo: Secondary | ICD-10-CM

## 2024-04-06 MED ORDER — OPZELURA 1.5 % EX CREA: 1.0000 | TOPICAL_CREAM | CUTANEOUS | 5 refills | Status: AC

## 2024-04-09 ENCOUNTER — Other Ambulatory Visit: Payer: Self-pay | Admitting: Internal Medicine

## 2024-04-19 NOTE — Progress Notes (Unsigned)
 "  GI Office Note    Referring Provider: Tobie Suzzane POUR, MD Primary Care Physician:  Tobie Suzzane POUR, MD Primary Gastroenterologist: Carlin POUR. Cindie, DO  Date:  04/19/2024  ID:  Jake King, DOB 22-Jun-1990, MRN [id number]   Chief Complaint   No chief complaint on file.  History of Present Illness  Jake King is a 34 y.o. male with a history of GERD, cirrhosis most likely 2/2 chronic EtOH abuse with recurrent alcoholic hepatitis presenting today with complaint of ***  Per review of chart he has had a complete serologic workup in the past including negative viral hepatitis panel.  Iron panel with elevated ferritin of 757 and iron saturation 85%.  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  Given the DNA findings his elevated iron saturation and ferritin was suspected to be secondary to alcohol intake.  Negative ANA.  Negative alpha 1 antitrypsin deficiency.  Elevated IgA but normal IgG and IgM.  Normal ceruloplasmin.  Negative AMA and ASMA  EGD March 2023: - Esophageal mucosal changes suggestive of eosinophilic esophagitis. Biopsied.  - Normal stomach.  - Normal duodenal bulb, first portion of the duodenum and second portion of the duodenum. - Path: Mildly reactive squamous mucosa. - Recommended PPI BID   Last office visit March 2024 with Jake Centers, PA-C.  He reported swelling in the right upper quadrant along with pressure and pain postprandially in the LUQ.  Noted some nausea and vomiting with vomit primarily in the mornings with mucus and gagging.  Taking PPI twice daily without any reflux.  Having early satiety and a decreased appetite.  He reportedly was feeling well on prednisolone  in the past as he had put on weight.  He reported ongoing alcohol use with 2-3 beers daily, reportedly slowly trying to decrease intake.  Noted to be seeing an management consultant.  Labs were ordered and he was counseled on EtOH cessation.  Continue PPI  twice daily.  Advised hepatitis A and B vaccination given not immune.   Today:  Labs - 03/21/2024 (no INR).  MELD 3.0: Unable to calculate recent score given absence of INR US : Last completed in December 2023 noting cirrhosis and some gallbladder wall thickening without any noted focal liver lesion. AFP: No AFP on file Hep A/B vaccination: EGD:  March 2023 -no evidence of varices. BB: not currently on NSBB Ascites/peripheral edema:  Diuretics:  Paracentesis:  History of SBP:  Encephalopathy:      Wt Readings from Last 6 Encounters:  03/20/24 217 lb (98.4 kg)  01/19/24 210 lb (95.3 kg)  02/25/23 220 lb (99.8 kg)  05/28/22 210 lb 9.6 oz (95.5 kg)  03/12/22 204 lb 6.4 oz (92.7 kg)  03/02/22 192 lb 7.4 oz (87.3 kg)    There is no height or weight on file to calculate BMI.   Current Outpatient Medications  Medication Sig Dispense Refill   albuterol  (VENTOLIN  HFA) 108 (90 Base) MCG/ACT inhaler Inhale 2 puffs into the lungs every 4 (four) hours as needed for wheezing or shortness of breath. 18 g 5   b complex vitamins capsule Take 1 capsule by mouth daily.     Buprenorphine  HCl-Naloxone  HCl 8-2 MG FILM Place 0.25 Film under the tongue daily.     folic acid  (FOLVITE ) 1 MG tablet Take 1 mg by mouth daily.     Multiple Vitamin (MULTIVITAMIN) tablet Take 1 tablet by mouth daily.     pantoprazole  (PROTONIX ) 40 MG tablet TAKE 1  TABLET BY MOUTH TWICE DAILY BEFORE A MEAL 60 tablet 0   QUEtiapine  (SEROQUEL ) 25 MG tablet Take 25 mg by mouth at bedtime.     Ruxolitinib Phosphate  (OPZELURA ) 1.5 % CREA Apply 1 Application topically as directed. Apply to arms 5 days a week Monday through Friday for vitiligo 60 g 5   triamcinolone  cream (KENALOG ) 0.1 % Apply 1 Application topically as directed. qd to aa arms on weekends only, avoid face, groin, axilla, for vitiligo 80 g 5   Vitamin D -Vitamin K (VITAMIN K2-VITAMIN D3 PO) Take by mouth.     No current facility-administered medications for this  visit.    Past Medical History:  Diagnosis Date   Brain mass    pineal cyst   Cirrhosis (HCC)    Fatty liver    Hepatitis    Hypertension    Migraine    Right knee injury    Seizures (HCC)    Vitamin D  deficiency     Past Surgical History:  Procedure Laterality Date   BIOPSY  06/10/2021   Procedure: BIOPSY;  Surgeon: Cindie Carlin POUR, DO;  [facility];  Service: Endoscopy;;   ESOPHAGOGASTRODUODENOSCOPY (EGD) WITH PROPOFOL  N/A 06/10/2021   Procedure: ESOPHAGOGASTRODUODENOSCOPY (EGD) WITH PROPOFOL ;  Surgeon: Cindie Carlin POUR, DO;  [facility];  Service: Endoscopy;  Laterality: N/A;  11:15am, ASA 2   NASAL SINUS SURGERY     NASAL SINUS SURGERY  2009    Family History  Problem Relation Age of Onset   Other Mother        Twisted Colon   Mental illness Mother    Diabetes Father    Hypertension Father    Pneumonia Father    Liver disease Father        unsure what   Ulcerative colitis Paternal Grandmother    Colon polyps Paternal Grandmother    Congestive Heart Failure Paternal Grandfather    Hypertension Paternal Grandfather    Diabetes Paternal Grandfather    Multiple sclerosis Paternal Grandfather    Colon cancer Neg Hx     Allergies as of 04/20/2024 - Review Complete 04/03/2024  Allergen Reaction Noted   Atropine Swelling 03/04/2020   Other Swelling 07/02/2020    Social History   Socioeconomic History   Marital status: Significant Other    Spouse name: Jake King   Number of children: 1   Years of education: GED   Highest education level: Not on file  Occupational History   Occupation: [employer]  Tobacco Use   Smoking status: Every Day    Current packs/day: 0.50    Types: Cigarettes    Passive exposure: Current   Smokeless tobacco: Former  Building Services Engineer status: Never Used  Substance and Sexual Activity   Alcohol use: Yes    Alcohol/week: 56.0 - 70.0 standard drinks of alcohol    Types: 56 - 70 Cans of beer per  week    Comment: daily 2 beers   Drug use: Not Currently    Comment: no drugs since 2018   Sexual activity: Yes  Other Topics Concern   Not on file  Social History Narrative   Lives with Jake King) and their kids   Caffeine use: none   Right handed    Social Drivers of Health   Tobacco Use: High Risk (04/03/2024)   Patient History    Smoking Tobacco Use: Every Day    Smokeless Tobacco Use: Former    Passive Exposure: Current  Financial Resource Strain: Not on file  Food Insecurity: No Food Insecurity (03/02/2022)   Hunger Vital Sign    Worried About Running Out of Food in the Last Year: Never true    Ran Out of Food in the Last Year: Never true  Transportation Needs: No Transportation Needs (03/02/2022)   PRAPARE - Administrator, Civil Service (Medical): No    Lack of Transportation (Non-Medical): No  Physical Activity: Not on file  Stress: Not on file  Social Connections: Unknown (07/15/2021)   Received from [facility]   Social Network    Social Network: Not on file  Depression (PHQ2-9): High Risk (01/19/2024)   Depression (PHQ2-9)    PHQ-2 Score: 11  Alcohol Screen: Not on file  Housing: Low Risk (03/02/2022)   Housing    Last Housing Risk Score: 0  Utilities: Not At Risk (03/02/2022)   AHC Utilities    Threatened with loss of utilities: No  Health Literacy: Not on file    Review of Systems   Gen: Denies fever, chills, anorexia. Denies fatigue, weakness, weight loss.  CV: Denies chest pain, palpitations, syncope, peripheral edema, and claudication. Resp: Denies dyspnea at rest, cough, wheezing, coughing up blood, and pleurisy. GI: See HPI Derm: Denies rash, itching, dry skin Psych: Denies depression, anxiety, memory loss, confusion. No homicidal or suicidal ideation.  Heme: Denies bruising, bleeding, and enlarged lymph nodes.  Physical Exam   There were no vitals taken for this visit.  General:   Alert and oriented. No distress noted.  Pleasant and cooperative.  Head:  Normocephalic and atraumatic. Eyes:  Conjuctiva clear without scleral icterus. Mouth:  Oral mucosa pink and moist. Good dentition. No lesions. Lungs:  Clear to auscultation bilaterally. No wheezes, rales, or rhonchi. No distress.  Heart:  S1, S2 present without murmurs appreciated.  Abdomen:  +BS, soft, non-tender and non-distended. No rebound or guarding. No HSM or masses noted. Rectal: *** Msk:  Symmetrical without gross deformities. Normal posture. Extremities:  Without edema. Neurologic:  Alert and  oriented x4 Psych:  Alert and cooperative. Normal mood and affect.  Assessment & Plan  JESUA TAMBLYN is a 34 y.o. male presenting today with ***    AFP, INR, RUQ US   Follow up   ***Follow up ***    Charmaine Melia, MSN, FNP-BC, AGACNP-BC [facility] "

## 2024-04-20 ENCOUNTER — Ambulatory Visit: Admitting: Gastroenterology

## 2024-06-01 ENCOUNTER — Ambulatory Visit: Admitting: Gastroenterology

## 2024-09-25 ENCOUNTER — Ambulatory Visit: Payer: Self-pay | Admitting: Internal Medicine

## 2024-10-02 ENCOUNTER — Ambulatory Visit: Admitting: Dermatology
